# Patient Record
Sex: Female | Born: 1970 | Race: White | Hispanic: Yes | Marital: Married | Smoking: Never smoker
Health system: Southern US, Community
[De-identification: ages and names within clinical notes are randomized; demographics above are authoritative.]

## PROBLEM LIST (undated history)

## (undated) DIAGNOSIS — E785 Hyperlipidemia, unspecified: Secondary | ICD-10-CM

## (undated) DIAGNOSIS — E049 Nontoxic goiter, unspecified: Secondary | ICD-10-CM

## (undated) DIAGNOSIS — E010 Iodine-deficiency related diffuse (endemic) goiter: Secondary | ICD-10-CM

## (undated) DIAGNOSIS — K3580 Unspecified acute appendicitis: Secondary | ICD-10-CM

## (undated) DIAGNOSIS — I10 Essential (primary) hypertension: Secondary | ICD-10-CM

## (undated) DIAGNOSIS — R739 Hyperglycemia, unspecified: Secondary | ICD-10-CM

## (undated) HISTORY — DX: Hyperlipidemia, unspecified: E78.5

## (undated) HISTORY — DX: Hyperglycemia, unspecified: R73.9

## (undated) HISTORY — DX: Nontoxic goiter, unspecified: E04.9

## (undated) HISTORY — DX: Iodine-deficiency related diffuse (endemic) goiter: E01.0

---

## 1995-07-29 HISTORY — PX: TUBAL LIGATION: SHX77

## 2004-11-06 ENCOUNTER — Ambulatory Visit: Payer: Self-pay | Admitting: Internal Medicine

## 2004-11-07 ENCOUNTER — Ambulatory Visit: Payer: Self-pay | Admitting: *Deleted

## 2004-11-12 ENCOUNTER — Ambulatory Visit: Payer: Self-pay | Admitting: Internal Medicine

## 2004-11-29 ENCOUNTER — Ambulatory Visit: Payer: Self-pay | Admitting: Internal Medicine

## 2004-12-31 ENCOUNTER — Ambulatory Visit: Payer: Self-pay | Admitting: Internal Medicine

## 2005-06-09 ENCOUNTER — Ambulatory Visit: Payer: Self-pay | Admitting: Internal Medicine

## 2006-01-27 ENCOUNTER — Ambulatory Visit: Payer: Self-pay | Admitting: Family Medicine

## 2006-02-03 ENCOUNTER — Ambulatory Visit: Payer: Self-pay | Admitting: Family Medicine

## 2006-04-15 ENCOUNTER — Ambulatory Visit: Payer: Self-pay | Admitting: Family Medicine

## 2006-04-15 ENCOUNTER — Encounter (INDEPENDENT_AMBULATORY_CARE_PROVIDER_SITE_OTHER): Payer: Self-pay | Admitting: Internal Medicine

## 2006-05-27 ENCOUNTER — Ambulatory Visit: Payer: Self-pay | Admitting: Family Medicine

## 2006-11-30 ENCOUNTER — Encounter (INDEPENDENT_AMBULATORY_CARE_PROVIDER_SITE_OTHER): Payer: Self-pay | Admitting: Internal Medicine

## 2006-11-30 DIAGNOSIS — J309 Allergic rhinitis, unspecified: Secondary | ICD-10-CM

## 2006-11-30 DIAGNOSIS — I10 Essential (primary) hypertension: Secondary | ICD-10-CM

## 2006-11-30 DIAGNOSIS — G43909 Migraine, unspecified, not intractable, without status migrainosus: Secondary | ICD-10-CM | POA: Insufficient documentation

## 2006-11-30 DIAGNOSIS — J302 Other seasonal allergic rhinitis: Secondary | ICD-10-CM | POA: Insufficient documentation

## 2007-06-14 ENCOUNTER — Telehealth (INDEPENDENT_AMBULATORY_CARE_PROVIDER_SITE_OTHER): Payer: Self-pay | Admitting: *Deleted

## 2007-06-22 ENCOUNTER — Ambulatory Visit: Payer: Self-pay | Admitting: Family Medicine

## 2007-07-20 ENCOUNTER — Ambulatory Visit: Payer: Self-pay | Admitting: Family Medicine

## 2007-08-18 ENCOUNTER — Emergency Department (HOSPITAL_COMMUNITY): Admission: EM | Admit: 2007-08-18 | Discharge: 2007-08-18 | Payer: Self-pay | Admitting: Emergency Medicine

## 2007-11-15 ENCOUNTER — Ambulatory Visit: Payer: Self-pay | Admitting: Family Medicine

## 2007-11-15 ENCOUNTER — Encounter (INDEPENDENT_AMBULATORY_CARE_PROVIDER_SITE_OTHER): Payer: Self-pay | Admitting: Family Medicine

## 2007-11-15 DIAGNOSIS — B3731 Acute candidiasis of vulva and vagina: Secondary | ICD-10-CM | POA: Insufficient documentation

## 2007-11-15 DIAGNOSIS — B373 Candidiasis of vulva and vagina: Secondary | ICD-10-CM

## 2007-11-15 LAB — CONVERTED CEMR LAB
Blood in Urine, dipstick: NEGATIVE
Glucose, Urine, Semiquant: NEGATIVE
Ketones, urine, test strip: NEGATIVE
WBC Urine, dipstick: NEGATIVE
pH: 6.5

## 2007-11-18 LAB — CONVERTED CEMR LAB: Pap Smear: NORMAL

## 2010-08-25 LAB — CONVERTED CEMR LAB
ALT: 15 units/L (ref 0–35)
AST: 19 units/L (ref 0–37)
Albumin: 4.1 g/dL (ref 3.5–5.2)
Alkaline Phosphatase: 101 units/L (ref 39–117)
Basophils Absolute: 0 10*3/uL (ref 0.0–0.1)
Basophils Relative: 0 % (ref 0–1)
Calcium: 9 mg/dL (ref 8.4–10.5)
Chloride: 103 meq/L (ref 96–112)
LDL Cholesterol: 88 mg/dL (ref 0–99)
MCHC: 31 g/dL (ref 30.0–36.0)
Monocytes Relative: 6 % (ref 3–12)
Neutro Abs: 7.2 10*3/uL (ref 1.7–7.7)
Neutrophils Relative %: 75 % (ref 43–77)
Platelets: 384 10*3/uL (ref 150–400)
Potassium: 3.3 meq/L — ABNORMAL LOW (ref 3.5–5.3)
RBC: 4.93 M/uL (ref 3.87–5.11)
RDW: 16.1 % — ABNORMAL HIGH (ref 11.5–15.5)
Sodium: 140 meq/L (ref 135–145)
TSH: 0.812 microintl units/mL (ref 0.350–5.50)

## 2013-04-22 ENCOUNTER — Encounter (HOSPITAL_COMMUNITY): Payer: Self-pay | Admitting: Adult Health

## 2013-04-22 ENCOUNTER — Observation Stay (HOSPITAL_COMMUNITY)
Admission: EM | Admit: 2013-04-22 | Discharge: 2013-04-24 | Disposition: A | Discharge status: Home / Self Care | Payer: Self-pay | Attending: General Surgery | Admitting: General Surgery

## 2013-04-22 DIAGNOSIS — K3580 Unspecified acute appendicitis: Principal | ICD-10-CM | POA: Insufficient documentation

## 2013-04-22 DIAGNOSIS — R1031 Right lower quadrant pain: Secondary | ICD-10-CM | POA: Insufficient documentation

## 2013-04-22 DIAGNOSIS — I10 Essential (primary) hypertension: Secondary | ICD-10-CM | POA: Insufficient documentation

## 2013-04-22 DIAGNOSIS — K37 Unspecified appendicitis: Secondary | ICD-10-CM

## 2013-04-22 HISTORY — DX: Essential (primary) hypertension: I10

## 2013-04-22 HISTORY — DX: Unspecified acute appendicitis: K35.80

## 2013-04-22 LAB — COMPREHENSIVE METABOLIC PANEL
AST: 21 U/L (ref 0–37)
CO2: 24 mEq/L (ref 19–32)
Calcium: 9 mg/dL (ref 8.4–10.5)
Chloride: 98 mEq/L (ref 96–112)
Creatinine, Ser: 0.5 mg/dL (ref 0.50–1.10)
GFR calc Af Amer: >90 mL/min (ref >90)
GFR calc non Af Amer: >90 mL/min (ref >90)
Glucose, Bld: 113 mg/dL — ABNORMAL HIGH (ref 70–99)
Total Protein: 7.7 g/dL (ref 6.0–8.3)

## 2013-04-22 LAB — URINALYSIS, ROUTINE W REFLEX MICROSCOPIC
Leukocytes, UA: NEGATIVE
Protein, ur: NEGATIVE mg/dL
Specific Gravity, Urine: 1.006 (ref 1.005–1.030)
Urobilinogen, UA: 0.2 mg/dL (ref 0.0–1.0)

## 2013-04-22 LAB — URINE MICROSCOPIC-ADD ON

## 2013-04-22 LAB — CBC WITH DIFFERENTIAL/PLATELET
Basophils Absolute: 0 10*3/uL (ref 0.0–0.1)
Eosinophils Absolute: 0 10*3/uL (ref 0.0–0.7)
Eosinophils Relative: 0 % (ref 0–5)
HCT: 36.6 % (ref 36.0–46.0)
Lymphocytes Relative: 9 % — ABNORMAL LOW (ref 12–46)
Lymphs Abs: 1.5 10*3/uL (ref 0.7–4.0)
MCH: 26.7 pg (ref 26.0–34.0)
MCHC: 33.9 g/dL (ref 30.0–36.0)
MCV: 78.9 fL (ref 78.0–100.0)
Monocytes Absolute: 0.7 10*3/uL (ref 0.1–1.0)
Neutrophils Relative %: 86 % — ABNORMAL HIGH (ref 43–77)
Platelets: 299 10*3/uL (ref 150–400)
RBC: 4.64 MIL/uL (ref 3.87–5.11)
RDW: 16.3 % — ABNORMAL HIGH (ref 11.5–15.5)
WBC: 16.6 10*3/uL — ABNORMAL HIGH (ref 4.0–10.5)

## 2013-04-22 LAB — POCT PREGNANCY, URINE: Preg Test, Ur: NEGATIVE

## 2013-04-22 NOTE — ED Notes (Addendum)
Spanish speaking, interpreter phones used. Presents with lower abdominal pain that radiates into lower back and shoots up into umbilicus. Pain is associated with emesis x1 and frequent urination. painis described as "strong" and rated 7/10, bialterally.  Pain began this evening.

## 2013-04-22 NOTE — ED Notes (Signed)
Nurse First Rounds : Nurse explained delay / process and wait time to pt.

## 2013-04-23 ENCOUNTER — Emergency Department (HOSPITAL_COMMUNITY): Payer: Self-pay

## 2013-04-23 ENCOUNTER — Encounter (HOSPITAL_COMMUNITY): Payer: Self-pay | Admitting: Radiology

## 2013-04-23 ENCOUNTER — Encounter (HOSPITAL_COMMUNITY): Payer: Self-pay | Admitting: Anesthesiology

## 2013-04-23 ENCOUNTER — Observation Stay (HOSPITAL_COMMUNITY): Payer: Self-pay | Admitting: Anesthesiology

## 2013-04-23 ENCOUNTER — Encounter (HOSPITAL_COMMUNITY)
Admission: EM | Disposition: A | Discharge status: Home / Self Care | Payer: Self-pay | Source: Home / Self Care | Attending: Emergency Medicine

## 2013-04-23 DIAGNOSIS — K3580 Unspecified acute appendicitis: Secondary | ICD-10-CM

## 2013-04-23 HISTORY — DX: Unspecified acute appendicitis: K35.80

## 2013-04-23 HISTORY — PX: LAPAROSCOPIC APPENDECTOMY: SHX408

## 2013-04-23 LAB — SURGICAL PCR SCREEN: MRSA, PCR: NEGATIVE

## 2013-04-23 SURGERY — APPENDECTOMY, LAPAROSCOPIC
Anesthesia: General | Wound class: Contaminated

## 2013-04-23 MED ORDER — SODIUM CHLORIDE 0.9 % IV BOLUS (SEPSIS)
1000.0000 mL | Freq: Once | INTRAVENOUS | Status: AC
  Administered 2013-04-23: 1000 mL via INTRAVENOUS

## 2013-04-23 MED ORDER — NEOSTIGMINE METHYLSULFATE 1 MG/ML IJ SOLN
INTRAMUSCULAR | Status: DC | PRN
  Administered 2013-04-23: 3 mg via INTRAVENOUS

## 2013-04-23 MED ORDER — ONDANSETRON HCL 4 MG/2ML IJ SOLN
4.0000 mg | Freq: Once | INTRAMUSCULAR | Status: AC
  Administered 2013-04-23: 4 mg via INTRAVENOUS
  Filled 2013-04-23: qty 2

## 2013-04-23 MED ORDER — LIDOCAINE HCL (CARDIAC) 20 MG/ML IV SOLN
INTRAVENOUS | Status: DC | PRN
  Administered 2013-04-23: 100 mg via INTRAVENOUS

## 2013-04-23 MED ORDER — LACTATED RINGERS IV SOLN
INTRAVENOUS | Status: DC | PRN
  Administered 2013-04-23 (×2): via INTRAVENOUS

## 2013-04-23 MED ORDER — MEPERIDINE HCL 25 MG/ML IJ SOLN: 6.2500 mg | INTRAMUSCULAR | Status: DC | PRN

## 2013-04-23 MED ORDER — OXYCODONE HCL 5 MG PO TABS: 5.0000 mg | ORAL_TABLET | Freq: Once | ORAL | Status: DC | PRN

## 2013-04-23 MED ORDER — MORPHINE SULFATE 4 MG/ML IJ SOLN
4.0000 mg | INTRAMUSCULAR | Status: DC | PRN
  Administered 2013-04-23 – 2013-04-24 (×3): 4 mg via INTRAVENOUS
  Filled 2013-04-23 (×3): qty 1

## 2013-04-23 MED ORDER — ONDANSETRON HCL 4 MG/2ML IJ SOLN: 4.0000 mg | Freq: Four times a day (QID) | INTRAMUSCULAR | Status: DC | PRN

## 2013-04-23 MED ORDER — HYDROMORPHONE HCL PF 1 MG/ML IJ SOLN: 0.2500 mg | INTRAMUSCULAR | Status: DC | PRN

## 2013-04-23 MED ORDER — OXYCODONE HCL 5 MG/5ML PO SOLN: 5.0000 mg | Freq: Once | ORAL | Status: DC | PRN

## 2013-04-23 MED ORDER — BUPIVACAINE-EPINEPHRINE PF 0.25-1:200000 % IJ SOLN
INTRAMUSCULAR | Status: AC
  Filled 2013-04-23: qty 30

## 2013-04-23 MED ORDER — INFLUENZA VAC SPLIT QUAD 0.5 ML IM SUSP: 0.5000 mL | INTRAMUSCULAR | Status: DC

## 2013-04-23 MED ORDER — SUCCINYLCHOLINE CHLORIDE 20 MG/ML IJ SOLN
INTRAMUSCULAR | Status: DC | PRN
  Administered 2013-04-23: 100 mg via INTRAVENOUS

## 2013-04-23 MED ORDER — FENTANYL CITRATE 0.05 MG/ML IJ SOLN
INTRAMUSCULAR | Status: DC | PRN
  Administered 2013-04-23: 150 ug via INTRAVENOUS

## 2013-04-23 MED ORDER — PHENYLEPHRINE HCL 10 MG/ML IJ SOLN
INTRAMUSCULAR | Status: DC | PRN
  Administered 2013-04-23: 120 ug via INTRAVENOUS
  Administered 2013-04-23 (×6): 80 ug via INTRAVENOUS
  Administered 2013-04-23: 40 ug via INTRAVENOUS
  Administered 2013-04-23 (×2): 80 ug via INTRAVENOUS

## 2013-04-23 MED ORDER — PIPERACILLIN-TAZOBACTAM 3.375 G IVPB 30 MIN
3.3750 g | Freq: Once | INTRAVENOUS | Status: AC
  Administered 2013-04-23: 3.375 g via INTRAVENOUS
  Filled 2013-04-23: qty 50

## 2013-04-23 MED ORDER — GLYCOPYRROLATE 0.2 MG/ML IJ SOLN
INTRAMUSCULAR | Status: DC | PRN
  Administered 2013-04-23: 0.4 mg via INTRAVENOUS

## 2013-04-23 MED ORDER — BUPIVACAINE-EPINEPHRINE 0.25% -1:200000 IJ SOLN
INTRAMUSCULAR | Status: DC | PRN
  Administered 2013-04-23: 20 mL

## 2013-04-23 MED ORDER — POTASSIUM CHLORIDE IN NACL 20-0.9 MEQ/L-% IV SOLN
INTRAVENOUS | Status: DC
  Administered 2013-04-23 (×2): via INTRAVENOUS
  Filled 2013-04-23 (×4): qty 1000

## 2013-04-23 MED ORDER — SODIUM CHLORIDE 0.9 % IR SOLN
Status: DC | PRN
  Administered 2013-04-23: 1000 mL

## 2013-04-23 MED ORDER — PIPERACILLIN-TAZOBACTAM 3.375 G IVPB
3.3750 g | Freq: Three times a day (TID) | INTRAVENOUS | Status: DC
  Administered 2013-04-23: 3.375 g via INTRAVENOUS
  Filled 2013-04-23 (×3): qty 50

## 2013-04-23 MED ORDER — ROCURONIUM BROMIDE 100 MG/10ML IV SOLN
INTRAVENOUS | Status: DC | PRN
  Administered 2013-04-23: 30 mg via INTRAVENOUS

## 2013-04-23 MED ORDER — MORPHINE SULFATE 4 MG/ML IJ SOLN
4.0000 mg | Freq: Once | INTRAMUSCULAR | Status: AC
  Administered 2013-04-23: 4 mg via INTRAVENOUS
  Filled 2013-04-23: qty 1

## 2013-04-23 MED ORDER — ONDANSETRON HCL 4 MG/2ML IJ SOLN
INTRAMUSCULAR | Status: DC | PRN
  Administered 2013-04-23: 4 mg via INTRAVENOUS

## 2013-04-23 MED ORDER — MIDAZOLAM HCL 5 MG/5ML IJ SOLN
INTRAMUSCULAR | Status: DC | PRN
  Administered 2013-04-23: 2 mg via INTRAVENOUS

## 2013-04-23 MED ORDER — ONDANSETRON HCL 4 MG/2ML IJ SOLN: 4.0000 mg | Freq: Once | INTRAMUSCULAR | Status: DC | PRN

## 2013-04-23 MED ORDER — PROPOFOL 10 MG/ML IV BOLUS
INTRAVENOUS | Status: DC | PRN
  Administered 2013-04-23: 130 mg via INTRAVENOUS

## 2013-04-23 SURGICAL SUPPLY — 39 items
ADH SKN CLS APL DERMABOND .7 (GAUZE/BANDAGES/DRESSINGS) ×1
APPLIER CLIP ROT 10 11.4 M/L (STAPLE)
APR CLP MED LRG 11.4X10 (STAPLE)
BAG SPEC RTRVL LRG 6X4 10 (ENDOMECHANICALS) ×1
BLADE SURG ROTATE 9660 (MISCELLANEOUS) IMPLANT
CANISTER SUCTION 2500CC (MISCELLANEOUS) ×2 IMPLANT
CHLORAPREP W/TINT 26ML (MISCELLANEOUS) ×2 IMPLANT
CLIP APPLIE ROT 10 11.4 M/L (STAPLE) IMPLANT
CLOTH BEACON ORANGE TIMEOUT ST (SAFETY) IMPLANT
COVER SURGICAL LIGHT HANDLE (MISCELLANEOUS) ×2 IMPLANT
CUTTER FLEX LINEAR 45M (STAPLE) ×2 IMPLANT
DECANTER SPIKE VIAL GLASS SM (MISCELLANEOUS) ×2 IMPLANT
DERMABOND ADVANCED (GAUZE/BANDAGES/DRESSINGS) ×1
DERMABOND ADVANCED .7 DNX12 (GAUZE/BANDAGES/DRESSINGS) ×1 IMPLANT
DRAPE UTILITY 15X26 W/TAPE STR (DRAPE) ×4 IMPLANT
ELECT REM PT RETURN 9FT ADLT (ELECTROSURGICAL) ×2
ELECTRODE REM PT RTRN 9FT ADLT (ELECTROSURGICAL) ×1 IMPLANT
ENDOLOOP SUT PDS II  0 18 (SUTURE)
ENDOLOOP SUT PDS II 0 18 (SUTURE) IMPLANT
GLOVE BIO SURGEON STRL SZ7.5 (GLOVE) ×2 IMPLANT
GOWN STRL NON-REIN LRG LVL3 (GOWN DISPOSABLE) ×4 IMPLANT
KIT BASIN OR (CUSTOM PROCEDURE TRAY) ×2 IMPLANT
KIT ROOM TURNOVER OR (KITS) ×2 IMPLANT
NS IRRIG 1000ML POUR BTL (IV SOLUTION) ×2 IMPLANT
PAD ARMBOARD 7.5X6 YLW CONV (MISCELLANEOUS) ×4 IMPLANT
POUCH SPECIMEN RETRIEVAL 10MM (ENDOMECHANICALS) ×2 IMPLANT
RELOAD STAPLE 45 3.5 BLU ETS (ENDOMECHANICALS) ×1 IMPLANT
RELOAD STAPLE TA45 3.5 REG BLU (ENDOMECHANICALS) ×2 IMPLANT
SCALPEL HARMONIC ACE (MISCELLANEOUS) ×2 IMPLANT
SET IRRIG TUBING LAPAROSCOPIC (IRRIGATION / IRRIGATOR) ×2 IMPLANT
SPECIMEN JAR SMALL (MISCELLANEOUS) ×2 IMPLANT
SUT MNCRL AB 4-0 PS2 18 (SUTURE) ×2 IMPLANT
TOWEL OR 17X24 6PK STRL BLUE (TOWEL DISPOSABLE) ×2 IMPLANT
TOWEL OR 17X26 10 PK STRL BLUE (TOWEL DISPOSABLE) ×2 IMPLANT
TRAY FOLEY CATH 16FR SILVER (SET/KITS/TRAYS/PACK) ×2 IMPLANT
TRAY LAPAROSCOPIC (CUSTOM PROCEDURE TRAY) ×2 IMPLANT
TROCAR XCEL BLUNT TIP 100MML (ENDOMECHANICALS) ×2 IMPLANT
TROCAR XCEL NON-BLD 5MMX100MML (ENDOMECHANICALS) ×4 IMPLANT
WATER STERILE IRR 1000ML POUR (IV SOLUTION) IMPLANT

## 2013-04-23 NOTE — Anesthesia Postprocedure Evaluation (Signed)
Anesthesia Post Note  Patient: Alexandra Wu  Procedure(s) Performed: Procedure(s) (LRB): APPENDECTOMY LAPAROSCOPIC (N/A)  Anesthesia type: general  Patient location: PACU  Post pain: Pain level controlled  Post assessment: Patient's Cardiovascular Status Stable  Last Vitals:  Filed Vitals:   04/23/13 0945  BP: 110/67  Pulse: 90  Temp:   Resp: 19    Post vital signs: Reviewed and stable  Level of consciousness: sedated  Complications: No apparent anesthesia complications

## 2013-04-23 NOTE — H&P (Signed)
Alexandra Wu is an 42 y.o. female.   Chief Complaint: Right lower quadrant abdominal pain HPI: This is a 42 year old Hispanic female. She does not speak [place]. All history is obtained her daughter. She started having right-sided abdominal pain and periumbilical pain yesterday. She also had nausea and vomiting. The pain is now sharp and constant and moderate to severe. She denies hematemesis. Bowel movements are normal. She denies fevers or chills  Past Medical History  Diagnosis Date  . Hypertension     History reviewed. No pertinent past surgical history.  History reviewed. No pertinent family history. Social History:  reports that she has never smoked. She does not have any smokeless tobacco history on file. She reports that she does not drink alcohol or use illicit drugs.  Allergies: No Known Allergies  Medications Prior to Admission  Medication Sig Dispense Refill  . lisinopril (PRINIVIL,ZESTRIL) 40 MG tablet Take 40 mg by mouth daily.      . Multiple Vitamins-Minerals (MULTIVITAMIN WITH MINERALS) tablet Take 1 tablet by mouth daily.        Results for orders placed during the hospital encounter of 04/22/13 (from the past 48 hour(s))  CBC WITH DIFFERENTIAL     Status: Abnormal   Collection Time    04/22/13  7:19 PM      Result Value Range   WBC 16.6 (*) 4.0 - 10.5 K/uL   RBC 4.64  3.87 - 5.11 MIL/uL   Hemoglobin 12.4  12.0 - 15.0 g/dL   HCT 78.4  69.6 - 29.5 %   MCV 78.9  78.0 - 100.0 fL   MCH 26.7  26.0 - 34.0 pg   MCHC 33.9  30.0 - 36.0 g/dL   RDW 28.4 (*) 13.2 - 44.0 %   Platelets 299  150 - 400 K/uL   Neutrophils Relative % 86 (*) 43 - 77 %   Neutro Abs 14.3 (*) 1.7 - 7.7 K/uL   Lymphocytes Relative 9 (*) 12 - 46 %   Lymphs Abs 1.5  0.7 - 4.0 K/uL   Monocytes Relative 4  3 - 12 %   Monocytes Absolute 0.7  0.1 - 1.0 K/uL   Eosinophils Relative 0  0 - 5 %   Eosinophils Absolute 0.0  0.0 - 0.7 K/uL   Basophils Relative 0  0 - 1 %   Basophils Absolute 0.0  0.0  - 0.1 K/uL  COMPREHENSIVE METABOLIC PANEL     Status: Abnormal   Collection Time    04/22/13  7:19 PM      Result Value Range   Sodium 135  135 - 145 mEq/L   Potassium 3.5  3.5 - 5.1 mEq/L   Chloride 98  96 - 112 mEq/L   CO2 24  19 - 32 mEq/L   Glucose, Bld 113 (*) 70 - 99 mg/dL   BUN 9  6 - 23 mg/dL   Creatinine, Ser 1.02  0.50 - 1.10 mg/dL   Calcium 9.0  8.4 - 72.5 mg/dL   Total Protein 7.7  6.0 - 8.3 g/dL   Albumin 3.8  3.5 - 5.2 g/dL   AST 21  0 - 37 U/L   ALT 15  0 - 35 U/L   Alkaline Phosphatase 73  39 - 117 U/L   Total Bilirubin 0.2 (*) 0.3 - 1.2 mg/dL   GFR calc non Af Amer >90  >90 mL/min   GFR calc Af Amer >90  >90 mL/min   Comment: (NOTE)  The eGFR has been calculated using the CKD EPI equation.     This calculation has not been validated in all clinical situations.     eGFR's persistently <90 mL/min signify possible Chronic Kidney     Disease.  LIPASE, BLOOD     Status: None   Collection Time    04/22/13  7:19 PM      Result Value Range   Lipase 29  11 - 59 U/L  URINALYSIS, ROUTINE W REFLEX MICROSCOPIC     Status: Abnormal   Collection Time    04/22/13  7:34 PM      Result Value Range   Color, Urine ORANGE (*) YELLOW   Comment: BIOCHEMICALS MAY BE AFFECTED BY COLOR   APPearance CLEAR  CLEAR   Specific Gravity, Urine 1.006  1.005 - 1.030   pH 8.5 (*) 5.0 - 8.0   Glucose, UA NEGATIVE  NEGATIVE mg/dL   Hgb urine dipstick NEGATIVE  NEGATIVE   Bilirubin Urine NEGATIVE  NEGATIVE   Ketones, ur NEGATIVE  NEGATIVE mg/dL   Protein, ur NEGATIVE  NEGATIVE mg/dL   Urobilinogen, UA 0.2  0.0 - 1.0 mg/dL   Nitrite POSITIVE (*) NEGATIVE   Leukocytes, UA NEGATIVE  NEGATIVE  URINE MICROSCOPIC-ADD ON     Status: Abnormal   Collection Time    04/22/13  7:34 PM      Result Value Range   Squamous Epithelial / LPF RARE  RARE   WBC, UA 0-2  <3 WBC/hpf   RBC / HPF 0-2  <3 RBC/hpf   Bacteria, UA FEW (*) RARE   Urine-Other AMORPHOUS URATES/PHOSPHATES    POCT PREGNANCY,  URINE     Status: None   Collection Time    04/22/13  7:48 PM      Result Value Range   Preg Test, Ur NEGATIVE  NEGATIVE   Comment:            THE SENSITIVITY OF THIS     METHODOLOGY IS >24 mIU/mL   Ct Abdomen Pelvis Wo Contrast  04/23/2013   CLINICAL DATA:  Right-sided flank pain. Rule out stone. Emesis and frequent urination.  EXAM: CT ABDOMEN AND PELVIS WITHOUT CONTRAST  TECHNIQUE: Multidetector CT imaging of the abdomen and pelvis was performed following the standard protocol without intravenous contrast.  COMPARISON:  None.  FINDINGS: Lung bases: Motion degradation. Mild cardiomegaly. Grossly clear lung bases. No pericardial or pleural effusion.  Abdomen/Pelvis: Motion degradation continuing into the upper abdomen. 2.6 cm low-density right liver lesion is favored to represent a cyst or minimally complex cyst. Normal infused appearance of the spleen, stomach, pancreas, gallbladder, biliary tract, adrenal glands.  No renal calculi or hydronephrosis. No hydroureter or ureteric calculi.  Mild aortic atherosclerosis. No retroperitoneal or retrocrural adenopathy. Normal colon and terminal ileum. The appendix is enlarged and contains a appendicolith approximately. Measures up to 1.2 cm on image 62. No gross surrounding inflammation seen. Evaluation for periappendiceal inflammation degraded by motion and lack of IV contrast.  Grossly normal small bowel, without free intraperitoneal air or fluid.  No pelvic adenopathy. Normal urinary bladder. Retroverted uterus. No gross adnexal mass or significant free pelvic fluid.  Bones/Musculoskeletal: No acute osseous abnormality.  IMPRESSION: 1. Motion degraded exam. 2. No urinary tract calculi or hydronephrosis. 3. Enlarged appendix with appendicolith within. Suspicious for acute appendicitis. Although no gross periappendiceal inflammation is seen, evaluation degraded by motion and lack of IV contrast. These results were called by telephone at the time of  interpretation on  04/23/2013 at 1:48 AM to Dr. Chaney Malling , who verbally acknowledged these results. 4. Right liver lobe lesion which is indeterminate. Possibly a complex cyst. Consider nonemergent followup dedicated ultrasound. If any history of primary malignancy, recommend dedicated pre and post contrast abdominal MRI (as an outpatient).   Electronically Signed   By: Jeronimo Greaves   On: 04/23/2013 01:49    Review of Systems  All other systems reviewed and are negative.    Blood pressure 139/76, pulse 80, temperature 98.1 F (36.7 C), temperature source Oral, resp. rate 20, height 4\' 11"  (1.499 m), weight 150 lb 9.2 oz (68.3 kg), SpO2 100.00%. Physical Exam  Constitutional: She is oriented to person, place, and time. She appears well-developed and well-nourished. No distress.  HENT:  Head: Normocephalic and atraumatic.  Right Ear: External ear normal.  Left Ear: External ear normal.  Nose: Nose normal.  Mouth/Throat: Oropharynx is clear and moist. No oropharyngeal exudate.  Eyes: Conjunctivae are normal. Pupils are equal, round, and reactive to light. Right eye exhibits no discharge. Left eye exhibits no discharge. No scleral icterus.  Neck: Normal range of motion. Neck supple. No tracheal deviation present. No thyromegaly present.  Cardiovascular: Normal rate, regular rhythm, normal heart sounds and intact distal pulses.   No murmur heard. Respiratory: Effort normal and breath sounds normal. No respiratory distress. She has no wheezes. She has no rales.  GI: Soft. There is tenderness. There is guarding.  Tenderness with guarding in the right lower quadrant  Musculoskeletal: Normal range of motion. She exhibits no edema and no tenderness.  Lymphadenopathy:    She has no cervical adenopathy.  Neurological: She is alert and oriented to person, place, and time.  Skin: Skin is warm and dry. No rash noted. She is not diaphoretic. No erythema.  Psychiatric: Her behavior is normal. Judgment  normal.     Assessment/Plan Acute appendicitis    Removal of the appendix is recommended. I discussed this briefly with the family. I discussed the laparoscopic approach. I discussed the risk of surgery as well. IV antibiotics have been given. Surgery will be planned for today  Aundra Espin A 04/23/2013, 6:21 AM

## 2013-04-23 NOTE — Anesthesia Preprocedure Evaluation (Addendum)
Anesthesia Evaluation  Patient identified by MRN, date of birth, ID band Patient awake    Reviewed: Allergy & Precautions, H&P , NPO status , Patient's Chart, lab work & pertinent test results  Airway Mallampati: I TM Distance: >3 FB Neck ROM: Full    Dental  (+) Teeth Intact   Pulmonary          Cardiovascular hypertension, Pt. on medications     Neuro/Psych    GI/Hepatic   Endo/Other    Renal/GU      Musculoskeletal   Abdominal   Peds  Hematology   Anesthesia Other Findings   Reproductive/Obstetrics                          Anesthesia Physical Anesthesia Plan  ASA: II and emergent  Anesthesia Plan: General   Post-op Pain Management:    Induction: Intravenous  Airway Management Planned: Oral ETT  Additional Equipment:   Intra-op Plan:   Post-operative Plan: Extubation in OR  Informed Consent: I have reviewed the patients History and Physical, chart, labs and discussed the procedure including the risks, benefits and alternatives for the proposed anesthesia with the patient or authorized representative who has indicated his/her understanding and acceptance.     Plan Discussed with: CRNA and Surgeon  Anesthesia Plan Comments:         Anesthesia Quick Evaluation

## 2013-04-23 NOTE — Preoperative (Signed)
Beta Blockers   Reason not to administer Beta Blockers:Not Applicable 

## 2013-04-23 NOTE — ED Provider Notes (Addendum)
CSN: [id number]     Arrival date & time 04/22/13  1855 History   First MD Initiated Contact with Patient 04/22/13 2344     Chief Complaint  Patient presents with  . Abdominal Pain   (Consider location/radiation/quality/duration/timing/severity/associated sxs/prior Treatment) The history is provided by the patient. The history is limited by a language barrier. A language interpreter was used.  Shiryl Ruddy is a 42 y.o. female hx of HTN here with abdominal pain and dysuria. Dysuria started today and frequency. Also R flank pain that is intermittent. She also complained of crampy periumbilical pain with radiation to the epigastrium. Denies fevers or chills but felt nauseous but did not vomit. Has history of high blood pressure but denies any abdominal surgeries in the past.   Translation by daughter  Past Medical History  Diagnosis Date  . Hypertension    History reviewed. No pertinent past surgical history. History reviewed. No pertinent family history. History  Substance Use Topics  . Smoking status: Never Smoker   . Smokeless tobacco: Not on file  . Alcohol Use: No   OB History   Grav Para Term Preterm Abortions TAB SAB Ect Mult Living                 Review of Systems  Gastrointestinal: Positive for nausea and abdominal pain.  All other systems reviewed and are negative.    Allergies  Review of patient's allergies indicates no known allergies.  Home Medications   Current Outpatient Rx  Name  Route  Sig  Dispense  Refill  . lisinopril (PRINIVIL,ZESTRIL) 40 MG tablet   Oral   Take 40 mg by mouth daily.         . Multiple Vitamins-Minerals (MULTIVITAMIN WITH MINERALS) tablet   Oral   Take 1 tablet by mouth daily.          BP 142/79  Pulse 84  Temp(Src) 98.7 F (37.1 C) (Oral)  Resp 18  SpO2 98% Physical Exam  Nursing note and vitals reviewed. Constitutional: She is oriented to person, place, and time. She appears well-developed and well-nourished.   Slightly uncomfortable   HENT:  Head: Normocephalic.  Mouth/Throat: Oropharynx is clear and moist.  Eyes: Conjunctivae are normal. Pupils are equal, round, and reactive to light.  Neck: Normal range of motion. Neck supple.  Cardiovascular: Normal rate, regular rhythm and normal heart sounds.   Pulmonary/Chest: Effort normal and breath sounds normal. No respiratory distress. She has no wheezes. She has no rales.  Abdominal: Soft. Bowel sounds are normal.  + mild R CVAT. Mild periumbilical and RLQ tenderness but no rebound.   Musculoskeletal: Normal range of motion.  Neurological: She is alert and oriented to person, place, and time.  Skin: Skin is warm and dry.  Psychiatric: She has a normal mood and affect. Her behavior is normal. Judgment and thought content normal.    ED Course  Procedures (including critical care time) Labs Review Labs Reviewed  CBC WITH DIFFERENTIAL - Abnormal; Notable for the following:    WBC 16.6 (*)    RDW 16.3 (*)    Neutrophils Relative % 86 (*)    Neutro Abs 14.3 (*)    Lymphocytes Relative 9 (*)    All other components within normal limits  COMPREHENSIVE METABOLIC PANEL - Abnormal; Notable for the following:    Glucose, Bld 113 (*)    Total Bilirubin 0.2 (*)    All other components within normal limits  URINALYSIS, ROUTINE W REFLEX MICROSCOPIC -  Abnormal; Notable for the following:    Color, Urine ORANGE (*)    pH 8.5 (*)    Nitrite POSITIVE (*)    All other components within normal limits  URINE MICROSCOPIC-ADD ON - Abnormal; Notable for the following:    Bacteria, UA FEW (*)    All other components within normal limits  LIPASE, BLOOD  POCT PREGNANCY, URINE   Imaging Review Ct Abdomen Pelvis Wo Contrast  04/23/2013   CLINICAL DATA:  Right-sided flank pain. Rule out stone. Emesis and frequent urination.  EXAM: CT ABDOMEN AND PELVIS WITHOUT CONTRAST  TECHNIQUE: Multidetector CT imaging of the abdomen and pelvis was performed following the  standard protocol without intravenous contrast.  COMPARISON:  None.  FINDINGS: Lung bases: Motion degradation. Mild cardiomegaly. Grossly clear lung bases. No pericardial or pleural effusion.  Abdomen/Pelvis: Motion degradation continuing into the upper abdomen. 2.6 cm low-density right liver lesion is favored to represent a cyst or minimally complex cyst. Normal infused appearance of the spleen, stomach, pancreas, gallbladder, biliary tract, adrenal glands.  No renal calculi or hydronephrosis. No hydroureter or ureteric calculi.  Mild aortic atherosclerosis. No retroperitoneal or retrocrural adenopathy. Normal colon and terminal ileum. The appendix is enlarged and contains a appendicolith approximately. Measures up to 1.2 cm on image 62. No gross surrounding inflammation seen. Evaluation for periappendiceal inflammation degraded by motion and lack of IV contrast.  Grossly normal small bowel, without free intraperitoneal air or fluid.  No pelvic adenopathy. Normal urinary bladder. Retroverted uterus. No gross adnexal mass or significant free pelvic fluid.  Bones/Musculoskeletal: No acute osseous abnormality.  IMPRESSION: 1. Motion degraded exam. 2. No urinary tract calculi or hydronephrosis. 3. Enlarged appendix with appendicolith within. Suspicious for acute appendicitis. Although no gross periappendiceal inflammation is seen, evaluation degraded by motion and lack of IV contrast. These results were called by telephone at the time of interpretation on 04/23/2013 at 1:48 AM to Dr. Chaney Malling , who verbally acknowledged these results. 4. Right liver lobe lesion which is indeterminate. Possibly a complex cyst. Consider nonemergent followup dedicated ultrasound. If any history of primary malignancy, recommend dedicated pre and post contrast abdominal MRI (as an outpatient).   Electronically Signed   By: Jeronimo Greaves   On: 04/23/2013 01:49    MDM  No diagnosis found. Ruweyda Macknight is a 42 y.o. female here with R  flank pain and periumbilical pain. UA + UTI. Need to r/o pyelo vs appy vs infected stone. Will get CT ab/pel, labs.   2:25 AM CT showed appendicitis. Given zosyn. Surgery will admit.    Richardean Canal, MD 04/23/13 0225  Richardean Canal, MD 04/23/13 [phone]

## 2013-04-23 NOTE — Op Note (Signed)
04/22/2013 - 04/23/2013  9:20 AM  PATIENT:  Alexandra Wu  42 y.o. female  PRE-OPERATIVE DIAGNOSIS:  appendicitis  POST-OPERATIVE DIAGNOSIS:  same  PROCEDURE:  Procedure(s): APPENDECTOMY LAPAROSCOPIC (N/A)  SURGEON:  Surgeon(s) and Role:    * Robyne Askew, MD - Primary  PHYSICIAN ASSISTANT:   ASSISTANTS: none   ANESTHESIA:   general  EBL:  Total I/O In: -  Out: 250 [Urine:250]  BLOOD ADMINISTERED:none  DRAINS: none   LOCAL MEDICATIONS USED:  MARCAINE     SPECIMEN:  Source of Specimen:  appendix  DISPOSITION OF SPECIMEN:  PATHOLOGY  COUNTS:  YES  TOURNIQUET:  * No tourniquets in log *  DICTATION: .Dragon Dictation After informed consent was obtained patient was brought to the operating room placed in the supine position on the operating room table. After adequate induction of general anesthesia the patient's abdomen was prepped with ChloraPrep, allowed to dry, and draped in usual sterile manner. The area below the umbilicus was infiltrated with quarter percent Marcaine. A small incision was made with a 15 blade knife. This incision was carried down through the subcutaneous tissue bluntly with a hemostat and [employer] retractors until the linea alba was identified. The linea alba was incised with a 15 blade knife. Each side was grasped Coker clamps and elevated anteriorly. The preperitoneal space was probed bluntly with a hemostat until the peritoneum was opened and access was gained to the abdominal cavity. A 0 Vicryl purse string stitch was placed in the fascia surrounding the opening. A Hassan cannula was placed through the opening and anchored in place with the previously placed Vicryl purse string stitch. The laparoscope was placed through the [facility] cannula. The abdomen was insufflated with carbon dioxide without difficulty. Next the suprapubic area was infiltrated with quarter percent Marcaine. A small incision was made with a 15 blade knife. A 5 mm port was placed  bluntly through this incision into the abdominal cavity. A site was then chosen between the 2 ports for placement of a 5 mm port. The area was infiltrated with quarter percent Marcaine. A small stab incision was made with a 15 blade knife. A 5 mm port was placed bluntly through this incision and the abdominal cavity under direct vision. The laparoscope was then moved to the suprapubic port. Using a Glassman grasper and harmonic scalpel the right lower quadrant was inspected. The appendix was readily identified. The appendix was elevated anteriorly and the mesoappendix was taken down sharply with the harmonic scalpel. Once the base of the appendix where it joined the cecum was identified and cleared of any tissue then a laparoscopic GIA blue load 6 row stapler was placed through the [facility] cannula. The stapler was placed across the base of the appendix clamped and fired thereby dividing the base of the appendix between staple lines. A laparoscopic bag was then inserted through the [facility] cannula. The appendix was placed within the bag and the bag was sealed. The abdomen was then irrigated with copious amounts of saline until the effluent was clear. No other abnormalities were noted. The appendix and bag were removed with the [facility] cannula through the infraumbilical port without difficulty. The fascial defect was closed with the previously placed Vicryl pursestring stitch as well as with another interrupted 0 Vicryl figure-of-eight stitch. The rest of the ports were removed under direct vision and were found to be hemostatic. The gas was allowed to escape. The skin incisions were closed with interrupted 4-0 Monocryl subcuticular stitches. Dermabond dressings  were applied. The patient tolerated the procedure well. At the end of the case all needle sponge and instrument counts were correct. The patient was then awakened and taken to recovery in stable condition.  PLAN OF CARE: Admit for overnight  observation  PATIENT DISPOSITION:  PACU - hemodynamically stable.   Delay start of Pharmacological VTE agent (>24hrs) due to surgical blood loss or risk of bleeding: not applicable

## 2013-04-23 NOTE — Anesthesia Procedure Notes (Signed)
Procedure Name: Intubation Date/Time: 04/23/2013 8:18 AM Performed by: Quentin Ore Pre-anesthesia Checklist: Patient identified, Emergency Drugs available, Suction available, Patient being monitored and Timeout performed Patient Re-evaluated:Patient Re-evaluated prior to inductionOxygen Delivery Method: Circle system utilized Preoxygenation: Pre-oxygenation with 100% oxygen Intubation Type: IV induction Ventilation: Mask ventilation without difficulty Laryngoscope Size: Mac and 3 Grade View: Grade I Tube type: Oral Tube size: 7.0 mm Number of attempts: 1 Airway Equipment and Method: Stylet Placement Confirmation: ETT inserted through vocal cords under direct vision,  positive ETCO2 and breath sounds checked- equal and bilateral Secured at: 21 cm Tube secured with: Tape Dental Injury: Teeth and Oropharynx as per pre-operative assessment

## 2013-04-23 NOTE — Transfer of Care (Signed)
Immediate Anesthesia Transfer of Care Note  Patient: Alexandra Wu  Procedure(s) Performed: Procedure(s): APPENDECTOMY LAPAROSCOPIC (N/A)  Patient Location: PACU  Anesthesia Type:General  Level of Consciousness: awake, alert  and oriented  Airway & Oxygen Therapy: Patient Spontanous Breathing and Patient connected to nasal cannula oxygen  Post-op Assessment: Report given to PACU RN, Post -op Vital signs reviewed and stable and Patient moving all extremities X 4  Post vital signs: Reviewed and stable  Complications: No apparent anesthesia complications

## 2013-04-23 NOTE — Interval H&P Note (Signed)
History and Physical Interval Note:  04/23/2013 7:50 AM  Alexandra Wu  has presented today for surgery, with the diagnosis of appendicitis  The various methods of treatment have been discussed with the patient and family. After consideration of risks, benefits and other options for treatment, the patient has consented to  Procedure(s): APPENDECTOMY LAPAROSCOPIC (N/A) as a surgical intervention .  The patient's history has been reviewed, patient examined, no change in status, stable for surgery.  I have reviewed the patient's chart and labs.  Questions were answered to the patient's satisfaction.     TOTH III,Hali Balgobin S

## 2013-04-24 ENCOUNTER — Encounter (HOSPITAL_COMMUNITY): Payer: Self-pay | Admitting: Surgery

## 2013-04-24 MED ORDER — OXYCODONE-ACETAMINOPHEN 5-325 MG PO TABS: 1.0000 | ORAL_TABLET | ORAL | Status: DC | PRN

## 2013-04-24 NOTE — Progress Notes (Signed)
1 Day Post-Op   Assessment: s/p Procedure(s): APPENDECTOMY LAPAROSCOPIC Patient Active Problem List   Diagnosis Date Noted  . Appendicitis, acute 04/23/2013  . CANDIDIASIS, VAGINAL 11/15/2007  . MIGRAINE HEADACHE 11/30/2006  . HYPERTENSION 11/30/2006  . ALLERGIC RHINITIS 11/30/2006    Doing well and able to go home  Plan: Discharge  Subjective: Not much pain, feels ok and able to go home  Objective: Vital signs in last 24 hours: Temp:  [98.1 F (36.7 C)-99.2 F (37.3 C)] 98.1 F (36.7 C) (09/28 0615) Pulse Rate:  [72-99] 73 (09/28 0615) Resp:  [10-20] 18 (09/28 0615) BP: (110-148)/(62-84) 144/82 mmHg (09/28 0615) SpO2:  [95 %-100 %] 96 % (09/28 0615)   Intake/Output from previous day: 09/27 0701 - 09/28 0700 In: 1960 [P.O.:960; I.V.:1000] Out: 250 [Urine:250]  General appearance: alert, cooperative and no distress Resp: clear to auscultation bilaterally Cardio: regular rate and rhythm, S1, S2 normal, no murmur, click, rub or gallop GI: Mild incisional tenderness, otherwise negative exam  Incision: healing well  Lab Results:   Recent Labs  04/22/13 1919  WBC 16.6*  HGB 12.4  HCT 36.6  PLT 299   BMET  Recent Labs  04/22/13 1919  NA 135  K 3.5  CL 98  CO2 24  GLUCOSE 113*  BUN 9  CREATININE 0.50  CALCIUM 9.0    MEDS, Scheduled . [START ON 04/25/2013] influenza vac split quadrivalent PF  0.5 mL Intramuscular Tomorrow-1000    Studies/Results: Ct Abdomen Pelvis Wo Contrast  04/23/2013   CLINICAL DATA:  Right-sided flank pain. Rule out stone. Emesis and frequent urination.  EXAM: CT ABDOMEN AND PELVIS WITHOUT CONTRAST  TECHNIQUE: Multidetector CT imaging of the abdomen and pelvis was performed following the standard protocol without intravenous contrast.  COMPARISON:  None.  FINDINGS: Lung bases: Motion degradation. Mild cardiomegaly. Grossly clear lung bases. No pericardial or pleural effusion.  Abdomen/Pelvis: Motion degradation continuing into  the upper abdomen. 2.6 cm low-density right liver lesion is favored to represent a cyst or minimally complex cyst. Normal infused appearance of the spleen, stomach, pancreas, gallbladder, biliary tract, adrenal glands.  No renal calculi or hydronephrosis. No hydroureter or ureteric calculi.  Mild aortic atherosclerosis. No retroperitoneal or retrocrural adenopathy. Normal colon and terminal ileum. The appendix is enlarged and contains a appendicolith approximately. Measures up to 1.2 cm on image 62. No gross surrounding inflammation seen. Evaluation for periappendiceal inflammation degraded by motion and lack of IV contrast.  Grossly normal small bowel, without free intraperitoneal air or fluid.  No pelvic adenopathy. Normal urinary bladder. Retroverted uterus. No gross adnexal mass or significant free pelvic fluid.  Bones/Musculoskeletal: No acute osseous abnormality.  IMPRESSION: 1. Motion degraded exam. 2. No urinary tract calculi or hydronephrosis. 3. Enlarged appendix with appendicolith within. Suspicious for acute appendicitis. Although no gross periappendiceal inflammation is seen, evaluation degraded by motion and lack of IV contrast. These results were called by telephone at the time of interpretation on 04/23/2013 at 1:48 AM to Dr. Chaney Malling , who verbally acknowledged these results. 4. Right liver lobe lesion which is indeterminate. Possibly a complex cyst. Consider nonemergent followup dedicated ultrasound. If any history of primary malignancy, recommend dedicated pre and post contrast abdominal MRI (as an outpatient).   Electronically Signed   By: Jeronimo Greaves   On: 04/23/2013 01:49      LOS: 2 days     Currie Paris, MD, [facility], [place] [phone]   04/24/2013 9:02 AM

## 2013-04-26 ENCOUNTER — Encounter (HOSPITAL_COMMUNITY): Payer: Self-pay | Admitting: General Surgery

## 2013-05-04 NOTE — Discharge Summary (Signed)
.  Patient ID: Alexandra Wu [id number] 42 y.o. 1971-06-01  Admission  Date: 04/22/2013  Discharge date and time: 04/24/2013 12:27 PM  Admitting Physician: Currie Paris  Discharge Physician: Currie Paris  Admission Diagnoses: Appendicitis [541]  Discharge Diagnoses: Acute appendicitis  Operations: Procedure(s): APPENDECTOMY LAPAROSCOPIC  Discharged Condition: good  Hospital Course: The patient was admitted, taken to the OR, underwent lap appy, did well post op and discharged the next morning  Consults: None  Significant Diagnostic Studies: Path: Diagnosis Appendix, Other than Incidental - ACUTE FULL THICKNESS APPENDICITIS WITH ACUTE SEROSITIS. - NO TUMOR SEEN - SEE COMMENT. Microscopic Comment    Disposition: Home

## 2014-05-08 ENCOUNTER — Ambulatory Visit: Payer: Self-pay | Attending: Family Medicine

## 2014-05-25 ENCOUNTER — Encounter: Payer: Self-pay | Admitting: Obstetrics & Gynecology

## 2017-02-07 LAB — GLUCOSE, POCT (MANUAL RESULT ENTRY): POC Glucose: 100 mg/dl — AB (ref 70–99)

## 2017-02-17 ENCOUNTER — Ambulatory Visit (INDEPENDENT_AMBULATORY_CARE_PROVIDER_SITE_OTHER): Payer: Self-pay | Admitting: Internal Medicine

## 2017-02-17 ENCOUNTER — Encounter: Payer: Self-pay | Admitting: Internal Medicine

## 2017-02-17 VITALS — BP 200/130 | HR 86 | Resp 12 | Ht <= 58 in | Wt 157.0 lb

## 2017-02-17 DIAGNOSIS — I10 Essential (primary) hypertension: Secondary | ICD-10-CM

## 2017-02-17 DIAGNOSIS — Z13220 Encounter for screening for lipoid disorders: Secondary | ICD-10-CM

## 2017-02-17 DIAGNOSIS — R739 Hyperglycemia, unspecified: Secondary | ICD-10-CM

## 2017-02-17 DIAGNOSIS — E010 Iodine-deficiency related diffuse (endemic) goiter: Secondary | ICD-10-CM

## 2017-02-17 HISTORY — DX: Hyperglycemia, unspecified: R73.9

## 2017-02-17 HISTORY — DX: Iodine-deficiency related diffuse (endemic) goiter: E01.0

## 2017-02-17 MED ORDER — LISINOPRIL-HYDROCHLOROTHIAZIDE 20-12.5 MG PO TABS: 1.0000 | ORAL_TABLET | Freq: Every day | ORAL | 11 refills | Status: DC

## 2017-02-17 MED ORDER — METOPROLOL TARTRATE 25 MG PO TABS: 25.0000 mg | ORAL_TABLET | Freq: Two times a day (BID) | ORAL | 11 refills | Status: DC

## 2017-02-17 NOTE — Progress Notes (Signed)
   Subjective:    Patient ID: Alexandra Wu, female    DOB: [birth year], 46 y.o.   MRN: [id number]  HPI   Here to establish  1.  Essential Hypertension:  Diagnosed 10 years ago.  Not clear her bp has ever been well controlled.  States in January following blood draw at [facility] on [address], she was told her kidney function was fine.    Patient shows me blood pressure medication including Amlodipine 10 mg daily, Lisinopril 20 mg, and separately, Diclofenac 75 mg twice daily. Was checked by [facility] on 02/07/2017 and bp was 204/108.  Switched to Lisinopril/HCTZ 20/12.5 mg that day. She is currently not taking any other of meds listed above--only Lisinopril/HCTZ   2. Hyperglycemia:  Sugar was 100 same date above.  States has had hyperglycemia previously  Current Meds  Medication Sig  . lisinopril-hydrochlorothiazide (PRINZIDE,ZESTORETIC) 20-12.5 MG tablet Take 1 tablet by mouth daily.  . Multiple Vitamins-Minerals (MULTIVITAMIN WITH MINERALS) tablet Take 1 tablet by mouth daily.  . [DISCONTINUED] lisinopril-hydrochlorothiazide (PRINZIDE,ZESTORETIC) 20-12.5 MG tablet Take 1 tablet by mouth daily.    No Known Allergies   Social History   Social History  . Marital status: Married    Spouse name: Reyne Dumas  . Number of children: 3  . Years of education: 1   Occupational History  . Not on file.   Social History Main Topics  . Smoking status: Never Smoker  . Smokeless tobacco: Never Used  . Alcohol use No  . Drug use: No  . Sexual activity: Not on file   Other Topics Concern  . Not on file   Social History Narrative   Originally from [place]   Came to [place] In 2006   Lives at home with husband, son and daughter in [place].     Daughter in law is Abby.      Review of Systems     Objective:   Physical Exam NAD HEENT:  PERRL, EOMI, wire effect of retinal vessls, TMs pearly gray, throat without injection Neck:  Supple, enlarged,  somewhat nodular feeling thyroid. Chest:  CTA CV:  RRR with Normal S1 and S2, No S3, or S4.  Radial and DP pulses normal and equal. Abd:  S, NT, No HSM or mass, No abdominal bruits. LE:  No edema        Assessment & Plan:  1.  Essential Hypertension:  Check CMP, CBC.  Continue Lisinopril/HCTZ.  Add Metoprolol 25 mg twice daily.  BP and pulse check in 1 week.    2.  Hyperglycemia:  A1C, CMP.  Will discuss diet and physical activity at follow up visit in 1 month.  3.  Thyromegaly:  TSH.  Suspect multinodular goiter.  4.  Screening for hypercholesterolemia:  FLP as fasting this morning.

## 2017-02-18 LAB — COMPREHENSIVE METABOLIC PANEL
ALBUMIN: 4.4 g/dL (ref 3.5–5.5)
ALT: 12 IU/L (ref 0–32)
AST: 19 IU/L (ref 0–40)
Albumin/Globulin Ratio: 1.5 (ref 1.2–2.2)
Alkaline Phosphatase: 85 IU/L (ref 39–117)
BUN/Creatinine Ratio: 14 (ref 9–23)
BUN: 7 mg/dL (ref 6–24)
Bilirubin Total: <0.2 mg/dL (ref 0.0–1.2)
CALCIUM: 9.1 mg/dL (ref 8.7–10.2)
CO2: 23 mmol/L (ref 20–29)
CREATININE: 0.5 mg/dL — AB (ref 0.57–1.00)
Chloride: 102 mmol/L (ref 96–106)
GFR calc Af Amer: 133 mL/min/{1.73_m2} (ref >59)
GFR calc non Af Amer: 116 mL/min/{1.73_m2} (ref >59)
Globulin, Total: 3 g/dL (ref 1.5–4.5)
Glucose: 104 mg/dL — ABNORMAL HIGH (ref 65–99)
Potassium: 3.9 mmol/L (ref 3.5–5.2)
Sodium: 142 mmol/L (ref 134–144)
TOTAL PROTEIN: 7.4 g/dL (ref 6.0–8.5)

## 2017-02-18 LAB — CBC WITH DIFFERENTIAL/PLATELET
BASOS ABS: 0 10*3/uL (ref 0.0–0.2)
BASOS: 0 %
EOS (ABSOLUTE): 0.1 10*3/uL (ref 0.0–0.4)
Eos: 1 %
HEMOGLOBIN: 13.1 g/dL (ref 11.1–15.9)
Hematocrit: 41.5 % (ref 34.0–46.6)
IMMATURE GRANULOCYTES: 0 %
Immature Grans (Abs): 0 10*3/uL (ref 0.0–0.1)
Lymphocytes Absolute: 1.9 10*3/uL (ref 0.7–3.1)
Lymphs: 23 %
MCH: 27.1 pg (ref 26.6–33.0)
MCHC: 31.6 g/dL (ref 31.5–35.7)
MCV: 86 fL (ref 79–97)
Monocytes Absolute: 0.6 10*3/uL (ref 0.1–0.9)
Monocytes: 7 %
NEUTROS PCT: 69 %
Neutrophils Absolute: 5.7 10*3/uL (ref 1.4–7.0)
PLATELETS: 302 10*3/uL (ref 150–379)
RBC: 4.83 x10E6/uL (ref 3.77–5.28)
RDW: 15.2 % (ref 12.3–15.4)
WBC: 8.3 10*3/uL (ref 3.4–10.8)

## 2017-02-18 LAB — HGB A1C W/O EAG: Hgb A1c MFr Bld: 5.6 % (ref 4.8–5.6)

## 2017-02-18 LAB — LIPID PANEL W/O CHOL/HDL RATIO
Cholesterol, Total: 176 mg/dL (ref 100–199)
HDL: 46 mg/dL (ref >39)
LDL Calculated: 87 mg/dL (ref 0–99)
TRIGLYCERIDES: 216 mg/dL — AB (ref 0–149)
VLDL CHOLESTEROL CAL: 43 mg/dL — AB (ref 5–40)

## 2017-02-18 LAB — TSH: TSH: 0.898 u[IU]/mL (ref 0.450–4.500)

## 2017-02-24 ENCOUNTER — Other Ambulatory Visit (INDEPENDENT_AMBULATORY_CARE_PROVIDER_SITE_OTHER): Payer: Self-pay

## 2017-02-24 VITALS — BP 190/100 | HR 74

## 2017-02-24 DIAGNOSIS — I10 Essential (primary) hypertension: Secondary | ICD-10-CM

## 2017-02-24 MED ORDER — METOPROLOL TARTRATE 50 MG PO TABS: ORAL_TABLET | ORAL | 11 refills | Status: DC

## 2017-02-24 NOTE — Progress Notes (Signed)
Per Dr. Amil Amen will recheck BP and pulse in 1 week

## 2017-03-04 ENCOUNTER — Ambulatory Visit (INDEPENDENT_AMBULATORY_CARE_PROVIDER_SITE_OTHER): Payer: Self-pay

## 2017-03-04 VITALS — BP 142/90 | HR 78

## 2017-03-04 DIAGNOSIS — I10 Essential (primary) hypertension: Secondary | ICD-10-CM

## 2017-03-24 ENCOUNTER — Encounter: Payer: Self-pay | Admitting: Internal Medicine

## 2017-03-24 ENCOUNTER — Ambulatory Visit (INDEPENDENT_AMBULATORY_CARE_PROVIDER_SITE_OTHER): Payer: Self-pay | Admitting: Internal Medicine

## 2017-03-24 VITALS — BP 184/110 | HR 70 | Resp 12 | Ht <= 58 in | Wt 159.0 lb

## 2017-03-24 DIAGNOSIS — E785 Hyperlipidemia, unspecified: Secondary | ICD-10-CM | POA: Insufficient documentation

## 2017-03-24 DIAGNOSIS — R739 Hyperglycemia, unspecified: Secondary | ICD-10-CM

## 2017-03-24 DIAGNOSIS — E049 Nontoxic goiter, unspecified: Secondary | ICD-10-CM

## 2017-03-24 DIAGNOSIS — I10 Essential (primary) hypertension: Secondary | ICD-10-CM

## 2017-03-24 HISTORY — DX: Hyperlipidemia, unspecified: E78.5

## 2017-03-24 HISTORY — DX: Nontoxic goiter, unspecified: E04.9

## 2017-03-24 NOTE — Patient Instructions (Addendum)
Tome un vaso de agua antes de cada comida Tome un minimo de 6 a 8 vasos de agua diarios Coma tres veces al dia Coma una proteina y Ardelia Mems grasa saludable con comida.  (huevos, pescado, pollo, pavo, y limite carnes rojas Coma 5 porciones diarias de legumbres.  Mezcle los colores Coma 2 porciones diarias de frutas con cascara cuando sea comestible Use platos pequeos Suelte su tenedor o cuchara despues de cada mordida hata que se mastique y se trague Come en la mesa con amigos o familiares por lo menos una vez al dia Apague la televisin y aparatos electrnicos durante la comida  Su objetivo debe ser perder Ardelia Mems libra por semana  Fill pill box the same day and time each week and check behind yourself regularly

## 2017-03-24 NOTE — Progress Notes (Signed)
   Subjective:    Patient ID: Alexandra Wu, female    DOB: [birth year], 46 y.o.   MRN: [id number]  HPI   1.  Essential Hypertension:  States thought was to take Metoprolol tartrate once daily and increased her dose to two 25 mg tabs once daily.  Not taking twice daily as prescribed.   Has 11 pills left from 02/17/2017 bottle, and was to be out about 4 days earlier, so about 10 days worth of medication that has been missed.   Has some extra Lisinopril/HCTZ as well in her bottle. States she is still finishing another bottle left over.  Discussed at length she should be done with her bottle each month.    Hyperglycemia:  Though A1C just in normal range at 5.6%  Nodular thyroid:  TSH was normal.   Dyslipidemia:  Discussed borderline low HDL and high Triglycerides and how this is frequently a pattern with developing DM.  Current Meds  Medication Sig  . lisinopril-hydrochlorothiazide (PRINZIDE,ZESTORETIC) 20-12.5 MG tablet Take 1 tablet by mouth daily.  . metoprolol tartrate (LOPRESSOR) 50 MG tablet 1 tab by mouth twice daily  . Multiple Vitamins-Minerals (MULTIVITAMIN WITH MINERALS) tablet Take 1 tablet by mouth daily.    No Known Allergies    Review of Systems     Objective:   Physical Exam NAD Neck:  Nodular thyroid Chest:  CTA CV:  RRR without murmur or rub, radial pulses normal and equal LE:  No edema       Assessment & Plan:  1.  Essential Hypertension:  Lifestyle changes for weight loss and control of BP discussed.  Increase Metoprolol to 50 mg and take twice daily and Lisinopril/HCTZ once daily without fail.  Pill box given  Recheck bp and pulse in 1 week.  OV with me in 3 months.  2.  Hyperglycemia:  A1C supports still in normal range with sugars, however, discussed her cholesterol suggests she is moving toward metabolic changes with DM.  Encouraged lifestyle changes for weight loss as above.  3.  Overweight:  Lifestyle changes.  4.  Dyslipidemia:  As  above.  5.  Nodular thyroid with normal TSH.  Send for thyroid ultrasound.

## 2017-03-31 ENCOUNTER — Other Ambulatory Visit (INDEPENDENT_AMBULATORY_CARE_PROVIDER_SITE_OTHER): Payer: Self-pay

## 2017-05-23 ENCOUNTER — Encounter: Payer: Self-pay | Admitting: Internal Medicine

## 2017-06-25 ENCOUNTER — Encounter: Payer: Self-pay | Admitting: Internal Medicine

## 2017-06-25 ENCOUNTER — Ambulatory Visit: Payer: Self-pay | Admitting: Internal Medicine

## 2017-06-25 VITALS — BP 198/100 | HR 86 | Resp 12 | Ht <= 58 in | Wt 160.0 lb

## 2017-06-25 DIAGNOSIS — E049 Nontoxic goiter, unspecified: Secondary | ICD-10-CM

## 2017-06-25 DIAGNOSIS — I10 Essential (primary) hypertension: Secondary | ICD-10-CM

## 2017-06-25 MED ORDER — METOPROLOL TARTRATE 50 MG PO TABS: ORAL_TABLET | ORAL | 3 refills | Status: DC

## 2017-06-25 MED ORDER — LISINOPRIL-HYDROCHLOROTHIAZIDE 20-12.5 MG PO TABS: 1.0000 | ORAL_TABLET | Freq: Every day | ORAL | 3 refills | Status: DC

## 2017-06-25 NOTE — Progress Notes (Signed)
   Subjective:    Patient ID: Alexandra Wu, female    DOB: 1971/06/04, 46 y.o.   MRN: [id number]  HPI   1.  Essential Hypertension:  States taking Lisinopril 20/12.5 mg daily.  Taking Metoprolol 50 mg twice daily.  States never misses.  States she is walking for 30 minutes every day.  Her weight is up by 1 lb.  Her HR is actually higher then it was before increasing her Metoprolol.   Avoiding salt.  Feels she is eating better, eating more vegetables, etc. Checks her BP at home and is getting in 150/90 with a wrist cuff.  Digital.    2.  Nodular thyroid:  TSH was normal after last visit.   No opening for the ultrasound of the thyroid as of yet.  3.  Heavy periods that last 2 week for many months with a lot of cramping.     Current Meds  Medication Sig  . lisinopril-hydrochlorothiazide (PRINZIDE,ZESTORETIC) 20-12.5 MG tablet Take 1 tablet by mouth daily.  . metoprolol tartrate (LOPRESSOR) 50 MG tablet 1 tab by mouth twice daily  . Multiple Vitamins-Minerals (MULTIVITAMIN WITH MINERALS) tablet Take 1 tablet by mouth daily.  . [DISCONTINUED] lisinopril-hydrochlorothiazide (PRINZIDE,ZESTORETIC) 20-12.5 MG tablet Take 1 tablet by mouth daily.  . [DISCONTINUED] metoprolol tartrate (LOPRESSOR) 50 MG tablet 1 tab by mouth twice daily   No Known Allergies    Review of Systems     Objective:   Physical Exam  NAD Lungs:  CTA CV:  RRR without murmur or rub.  Radial and DP pulses normal and equal. LE:  No edema      Assessment & Plan:  1.  Essential Hypertension:  Suspect not taking medication as described.  Later, pharmacy confirms she has missed almost 2 months worth of medication due to missed fills or very late fills. States her husband has difficulty picking up on monthly basis with his job. Feels switching to 90 day refills will be helpful Encouraged patient to share she is missing meds in future so we don't just keep adding unnecessary medication. BP follow up in 2  weeks, to bring her monitor to compare measurement.  2.  Heavy and prolonged periods:   Discuss period when follows up in 2 weeks. Last pap was [facility] years ago [facility].

## 2017-07-09 ENCOUNTER — Encounter: Payer: Self-pay | Admitting: Internal Medicine

## 2017-07-09 ENCOUNTER — Ambulatory Visit: Payer: Self-pay | Admitting: Internal Medicine

## 2017-07-09 VITALS — BP 140/98 | HR 70 | Resp 12 | Ht <= 58 in | Wt 155.0 lb

## 2017-07-09 DIAGNOSIS — R102 Pelvic and perineal pain: Secondary | ICD-10-CM

## 2017-07-09 DIAGNOSIS — N921 Excessive and frequent menstruation with irregular cycle: Secondary | ICD-10-CM

## 2017-07-09 DIAGNOSIS — I10 Essential (primary) hypertension: Secondary | ICD-10-CM

## 2017-07-09 MED ORDER — IBUPROFEN 200 MG PO TABS: ORAL_TABLET | ORAL | 0 refills | Status: DC

## 2017-07-09 NOTE — Progress Notes (Signed)
   Subjective:    Patient ID: Donald Pore, female    DOB: 05-Feb-1971, 46 y.o.   MRN: [id number]  HPI   1.  Essential Hypertension:  See previous visit--was missing meds previously due to difficulties getting to pharmacy monthly.  She has been taking Metoprolol twice daily and Lisinopril/HCTZ once daily without fail for past 2 weeks.  We changed her fills to 90 days at a time at November 29th visit.  2.  Periods with lots of cramping, lasting 3 days, goes away for a couple of days, then has flow for another 3 days with cramping again.  Feels her bleeding is heavy for the 6 days of flow.   Has always had heavy periods, just did not have the 2 day stop in flow. Previously lasted 5 days, with only 3 of the days with heavy flow.   Feels burning in her vaginal area before her period.   These symptoms for a year. Last pap [facility] years ago at [facility].   Sounds like had BV and treated then. LMP:  06/28/2017  Current Meds  Medication Sig  . lisinopril-hydrochlorothiazide (PRINZIDE,ZESTORETIC) 20-12.5 MG tablet Take 1 tablet by mouth daily.  . metoprolol tartrate (LOPRESSOR) 50 MG tablet 1 tab by mouth twice daily  . Multiple Vitamins-Minerals (MULTIVITAMIN WITH MINERALS) tablet Take 1 tablet by mouth daily.    No Known Allergies  Review of Systems     Objective:   Physical Exam  NAD Lungs:  CTA CV: RRR without murmur or rub, radial and DP pulses normal and equal Abd:  S, + BS, No HSM or mass, + BS GU:  Normal external genitalia.  No uterine or adnexal mass, mildly tender on palpation of uterus.  No vaginal discharge.  Cervix without lesion.        Assessment & Plan:  1.  Essential Hypertension:  Improved with consistency of medication use.  Not quite at goal.   Return in 1 month for BP and pulse check with nurse. With me in 3 months.  2.  Heavy periods with new on and off flow over past year:  Possibly just part of perimenopause, but check pelvic  ultrasound to confirm no concerning internal uterine abnormality. Ibuprofen for pain with period.

## 2017-08-07 ENCOUNTER — Ambulatory Visit (INDEPENDENT_AMBULATORY_CARE_PROVIDER_SITE_OTHER): Payer: Self-pay

## 2017-08-07 VITALS — BP 150/90 | HR 58

## 2017-08-07 DIAGNOSIS — I10 Essential (primary) hypertension: Secondary | ICD-10-CM

## 2017-08-07 NOTE — Progress Notes (Signed)
Patient states she has been taking medication daily without missing any doses. Patient states she had a week of not sleeping very well so she thinks that is what has her BP elevated.

## 2017-08-10 NOTE — Progress Notes (Signed)
If she does not have a follow up BP, then need to have her come back for repeat in a couple of weeks for nurse check again.

## 2017-08-11 NOTE — Progress Notes (Signed)
Nicky please call patient and schedule repeat BP check in 2 weeks. Please make sure patient is informed to take medication before she comes in for appointment.

## 2017-08-12 NOTE — Progress Notes (Signed)
Patient was informed and appt. Was made for 08/26/17 at 4:30 p.m.

## 2017-08-26 ENCOUNTER — Ambulatory Visit (INDEPENDENT_AMBULATORY_CARE_PROVIDER_SITE_OTHER): Payer: Self-pay

## 2017-08-26 VITALS — BP 130/90 | HR 70

## 2017-08-26 DIAGNOSIS — I10 Essential (primary) hypertension: Secondary | ICD-10-CM

## 2017-08-26 NOTE — Progress Notes (Signed)
Patient BP has come down. Informed to continue both BP medications and will call with any changes

## 2017-11-05 ENCOUNTER — Ambulatory Visit: Payer: Self-pay | Admitting: Internal Medicine

## 2017-11-05 ENCOUNTER — Encounter: Payer: Self-pay | Admitting: Internal Medicine

## 2017-11-05 VITALS — BP 150/88 | HR 66 | Resp 12 | Ht <= 58 in | Wt 158.0 lb

## 2017-11-05 DIAGNOSIS — N939 Abnormal uterine and vaginal bleeding, unspecified: Secondary | ICD-10-CM

## 2017-11-05 DIAGNOSIS — J302 Other seasonal allergic rhinitis: Secondary | ICD-10-CM

## 2017-11-05 DIAGNOSIS — M542 Cervicalgia: Secondary | ICD-10-CM

## 2017-11-05 DIAGNOSIS — I10 Essential (primary) hypertension: Secondary | ICD-10-CM

## 2017-11-05 DIAGNOSIS — F439 Reaction to severe stress, unspecified: Secondary | ICD-10-CM

## 2017-11-05 MED ORDER — FEXOFENADINE HCL 180 MG PO TABS: 180.0000 mg | ORAL_TABLET | Freq: Every day | ORAL | Status: DC

## 2017-11-05 MED ORDER — HYDROCHLOROTHIAZIDE 12.5 MG PO TABS: ORAL_TABLET | ORAL | 11 refills | Status: DC

## 2017-11-05 NOTE — Progress Notes (Signed)
   Subjective:    Patient ID: Alexandra Wu, female    DOB: [birth year], 47 y.o.   MRN: [id number]  HPI   1.  Essential Hypertension: States only missed one tab this week.  States otherwise never misses.  Describes taking Metoprolol twice daily and Lisinopril/HcTZ daily.    2.  Poor sleep:  Having back pain and nasal congestion with sore throat, the latter making it hard for her to breath.  The nasal congestion started about 2 weeks ago with onset of tree pollen.  She is also having itchy eyes. The back pain is really posterior neck pain.  This has been bothering her for 1 month.  Has not tried medication for the neck pain.  Did try Amoxicillin for the nasal congestion.  Discussed why obtaining an antibiotic without a prescription is a bad idea and drug resistance. Having some stress dealing with a child.  3.  Abnormal uterine bleeding:  Never received a slot for pelvic ultrasound.  Last period was March 23rd and stayed for 12 days.   Had period in Jan/Feb similar to what she had in December--period started for 3 days, stopped 2 days and resumed for 3 days.  Both times with moderate flow. GU exam was unremarkable with visit in December.  Current Meds  Medication Sig  . ibuprofen (ADVIL,MOTRIN) 200 MG tablet 2-4 tabs by mouth every 6 hours as needed for cramping with food  . lisinopril-hydrochlorothiazide (PRINZIDE,ZESTORETIC) 20-12.5 MG tablet Take 1 tablet by mouth daily.  . metoprolol tartrate (LOPRESSOR) 50 MG tablet 1 tab by mouth twice daily  . Multiple Vitamins-Minerals (MULTIVITAMIN WITH MINERALS) tablet Take 1 tablet by mouth daily.    No Known Allergies  Review of Systems     Objective:   Physical Exam  NAD HEENT:  PERRL, EOMI, Conjunctivae without injection.  TMs pearly gray.  Throat without cobbling.  Nasal bogginess and clear discharge. Neck:  Supple, No adenopathy  NT over spinous processes of Cspine.  Tender and tight over paraspinous musculature and  traps. Chest:  CTA CV:  RRR without murmur or rub radial pulses normal and equal Abd:  S, NT, No HSM or mass.  No suprapubic dullness to percussion or palpable uterus above pubis.  + BS    Assessment & Plan:  1.  Essential Hypertension: add HCTZ 12.5 mg to Lisinopril/HCTZ and Metoprolol.  If controls BP, will switch to 1 tab combination of the two former tabs.   BP and pulse check with BMP in 2 weeks.  2.  Seasonal allergies:   Fexofenadine 180 mg daily.  To call in 2 weeks if no mprovement and consider nasal corticosteroids  3.  Stress:  She is having difficulties apparently dealing with a child, but seems resistant to counseling.  Ultimately states she open to speaking with Metta Clines, LCSW.  4.  Abnormal Uterine Bleeding:  May be due to perimenopause.  Looking into when when we can get an ultrasound through [facility] orange card.  5.  Neck pain :  Ibuprofen and stretches discussed.

## 2017-11-10 ENCOUNTER — Telehealth: Payer: Self-pay | Admitting: Licensed Clinical Social Worker

## 2017-11-10 NOTE — Telephone Encounter (Signed)
LCSW called pt following referral from Dr. Amil Amen. Pt stated that she was not interested in counseling at this time, though she does experience stress. She shared that her adult daughter is a lesbian and this is very hard for her to accept, as it goes against her religious beliefs. She shared that she goes to church to cope with her stress. LCSW encouraged her to call to schedule for counseling at any time.

## 2017-11-19 ENCOUNTER — Other Ambulatory Visit: Payer: Self-pay

## 2017-11-23 ENCOUNTER — Other Ambulatory Visit: Payer: Self-pay

## 2017-11-23 VITALS — BP 150/80 | HR 68

## 2017-11-23 DIAGNOSIS — Z79899 Other long term (current) drug therapy: Secondary | ICD-10-CM

## 2017-11-24 LAB — BASIC METABOLIC PANEL
BUN/Creatinine Ratio: 14 (ref 9–23)
BUN: 7 mg/dL (ref 6–24)
CALCIUM: 9.1 mg/dL (ref 8.7–10.2)
CO2: 20 mmol/L (ref 20–29)
Chloride: 101 mmol/L (ref 96–106)
Creatinine, Ser: 0.5 mg/dL — ABNORMAL LOW (ref 0.57–1.00)
GFR calc Af Amer: 132 mL/min/{1.73_m2} (ref >59)
GFR, EST NON AFRICAN AMERICAN: 115 mL/min/{1.73_m2} (ref >59)
Glucose: 109 mg/dL — ABNORMAL HIGH (ref 65–99)
POTASSIUM: 4.1 mmol/L (ref 3.5–5.2)
Sodium: 139 mmol/L (ref 134–144)

## 2017-12-07 ENCOUNTER — Ambulatory Visit: Payer: Self-pay

## 2017-12-07 VITALS — BP 130/82 | HR 58

## 2017-12-07 DIAGNOSIS — I10 Essential (primary) hypertension: Secondary | ICD-10-CM

## 2017-12-07 NOTE — Progress Notes (Signed)
Patient BP now in normal range. Informed to continue current dose of medication and make sure she is not skipping doses. Patient verbalized understanding

## 2018-01-31 ENCOUNTER — Telehealth: Payer: Self-pay | Admitting: Internal Medicine

## 2018-02-22 NOTE — Telephone Encounter (Signed)
Referral to be sent to [facility] 02/25/18 to be schedule. -Marked as priority.

## 2018-02-25 ENCOUNTER — Encounter: Payer: Self-pay | Admitting: Internal Medicine

## 2018-03-03 ENCOUNTER — Encounter: Payer: Self-pay | Admitting: Internal Medicine

## 2018-03-03 ENCOUNTER — Ambulatory Visit: Payer: Self-pay | Admitting: Internal Medicine

## 2018-03-03 VITALS — BP 138/90 | HR 66 | Resp 12 | Ht <= 58 in | Wt 158.0 lb

## 2018-03-03 DIAGNOSIS — I10 Essential (primary) hypertension: Secondary | ICD-10-CM

## 2018-03-03 DIAGNOSIS — Z1239 Encounter for other screening for malignant neoplasm of breast: Secondary | ICD-10-CM

## 2018-03-03 DIAGNOSIS — Z23 Encounter for immunization: Secondary | ICD-10-CM

## 2018-03-03 DIAGNOSIS — Z1231 Encounter for screening mammogram for malignant neoplasm of breast: Secondary | ICD-10-CM

## 2018-03-03 DIAGNOSIS — Z0000 Encounter for general adult medical examination without abnormal findings: Secondary | ICD-10-CM

## 2018-03-03 MED ORDER — LISINOPRIL-HYDROCHLOROTHIAZIDE 20-25 MG PO TABS: 1.0000 | ORAL_TABLET | Freq: Every day | ORAL | 3 refills | Status: DC

## 2018-03-03 MED ORDER — CETIRIZINE HCL 10 MG PO TABS: 10.0000 mg | ORAL_TABLET | Freq: Every day | ORAL | 11 refills | Status: DC

## 2018-03-03 NOTE — Progress Notes (Signed)
Subjective:    Patient ID: Alexandra Wu, female    DOB: 07-04-1971, 47 y.o.   MRN: [id number]  HPI   CPE without pap  1.  Pap:  Previously was 2 years ago at a [facility].  Always normal.  No family history of cervical cancer.  2.  Mammogram:  Last was 2 years ago at [facility].  Normal.  No family history of breast cancer.    3.  Osteoprevention:  Does not eat or drink cheese or yogurt everyday, but does eat maybe every other day.  She does like milk.  She drinks soy milk and would be willing to drink 3 cups daily.   Walking daily for about 30 to 40 minutes.    4.  Guaiac Cards: Never.  5.  Colonoscopy:  Never.  No family history of colon cancer.  6.  Immunizations:  She cannot recall when she has had a Tetanus vaccine.  Immunization History  Administered Date(s) Administered  . Influenza Whole 05/27/2006    7.  Glucose/Cholesterol:  Has had A1C of 5.6% in 01/2017 and elevated triglycerides similar date as well.  Lipid Panel     Component Value Date/Time   CHOL 176 02/17/2017 1004   TRIG 216 (H) 02/17/2017 1004   HDL 46 02/17/2017 1004   CHOLHDL 4.1 Ratio 06/22/2007 0000   VLDL 38 06/22/2007 0000   LDLCALC 87 02/17/2017 1004    Essential Hypertension:  Not taking her HCTZ 12.5 mg extra that prescribed last April.  States the pharmacy has not been filling for her.   Current Meds  Medication Sig  . ibuprofen (ADVIL,MOTRIN) 200 MG tablet 2-4 tabs by mouth every 6 hours as needed for cramping with food  . lisinopril-hydrochlorothiazide (PRINZIDE,ZESTORETIC) 20-12.5 MG tablet Take 1 tablet by mouth daily.  . metoprolol tartrate (LOPRESSOR) 50 MG tablet 1 tab by mouth twice daily  . Multiple Vitamins-Minerals (MULTIVITAMIN WITH MINERALS) tablet Take 1 tablet by mouth daily.  . Omega-3 Fatty Acids (OMEGA-3 FISH OIL PO) Take by mouth daily.    Past Medical History:  Diagnosis Date  . Appendicitis, acute 04/23/2013   Lap appendectomy on  03/23/13  . Dyslipidemia 03/24/2017  . Hyperglycemia 02/17/2017  . Hypertension   . Nodular goiter 03/24/2017  . Thyromegaly 02/17/2017    Past Surgical History:  Procedure Laterality Date  . LAPAROSCOPIC APPENDECTOMY N/A 04/23/2013   Procedure: APPENDECTOMY LAPAROSCOPIC;  Surgeon: Merrie Roof, MD;  Location: [place];  Service: General;  Laterality: N/A;  . TUBAL LIGATION  1997    Family History  Problem Relation Age of Onset  . Arthritis Mother   . Hypertension Mother   . Arthritis Sister   . Diabetes Sister   . Heart disease Brother   . Arthritis Sister   . Headache Daughter    Social History   Socioeconomic History  . Marital status: Married    Spouse name: Reyne Dumas  . Number of children: 3  . Years of education: 1  . Highest education level: Not on file  Occupational History  . Occupation: Arboriculturist for offices  Social Needs  . Financial resource strain: Not very hard  . Food insecurity:    Worry: Never true    Inability: Never true  . Transportation needs:    Medical: No    Non-medical: No  Tobacco Use  . Smoking status: Never Smoker  . Smokeless tobacco: Never Used  Substance and Sexual Activity  .  Alcohol use: No  . Drug use: No  . Sexual activity: Yes    Birth control/protection: Surgical  Lifestyle  . Physical activity:    Days per week: 7 days    Minutes per session: 30 min  . Stress: Not on file  Relationships  . Social connections:    Talks on phone: Not on file    Gets together: Not on file    Attends religious service: Not on file    Active member of club or organization: Not on file    Attends meetings of clubs or organizations: Not on file    Relationship status: Not on file  . Intimate partner violence:    Fear of current or ex partner: No    Emotionally abused: No    Physically abused: No    Forced sexual activity: No  Other Topics Concern  . Not on file  Social History Narrative   Originally from [place]    Came to [place]. In 2006   Lives at home with husband   No Known Allergies Review of Systems  Constitutional: Positive for fatigue. Negative for appetite change.  HENT: Positive for dental problem (Pain in upper right molar.  other teeth are "breaking apart"), ear pain (When has allergies--when nose stuffed up.), sneezing and sore throat. Negative for hearing loss.   Eyes: Positive for visual disturbance (Had eye check in 08/2016.  She wears glasses now.).  Respiratory: Negative for shortness of breath.   Cardiovascular: Positive for chest pain (High upper left chest.  Not very frequently.  Generally at rest.  Mild.  ).  Gastrointestinal: Negative for abdominal pain, blood in stool, constipation and diarrhea.  Genitourinary: Positive for dysuria and frequency.       No period since April until July 5th.  Lasted 3 days, stopped, then spotting for 1 day.  Was not heavy as before.  Musculoskeletal:       Knees feel heavy.  Neurological: Negative for weakness and numbness.  Psychiatric/Behavioral: Negative for dysphoric mood. The patient is not nervous/anxious.        Objective:   Physical Exam  Constitutional: She is oriented to person, place, and time. She appears well-developed and well-nourished.  HENT:  Head: Normocephalic and atraumatic.  Right Ear: Hearing, tympanic membrane, external ear and ear canal normal.  Left Ear: Hearing, tympanic membrane, external ear and ear canal normal.  Nose: Mucosal edema present.  Mouth/Throat: Uvula is midline, oropharynx is clear and moist and mucous membranes are normal.  Dental decay  Eyes: Pupils are equal, round, and reactive to light. Conjunctivae and EOM are normal.  Discs sharp bilaterally  Neck: Normal range of motion and full passive range of motion without pain. Neck supple. No thyroid mass and no thyromegaly present.  Cardiovascular: Normal rate, regular rhythm, S1 normal and S2 normal. Exam reveals no S3, no S4 and no friction rub.  No  murmur heard. No carotid bruits.  Carotid, radial, femoral, DP and PT pulses normal and equal.   Pulmonary/Chest: Effort normal and breath sounds normal. Right breast exhibits no inverted nipple, no mass, no nipple discharge, no skin change and no tenderness. Left breast exhibits no inverted nipple, no mass, no nipple discharge, no skin change and no tenderness.  Abdominal: Soft. Bowel sounds are normal. She exhibits no mass. There is no hepatosplenomegaly. There is no tenderness. No hernia.  Genitourinary: Rectal exam shows no mass and guaiac negative stool.  Genitourinary Comments: Normal external female genitalia.  No uterine or adnexal mass or tenderness.   Uterus retroverted.  Musculoskeletal: Normal range of motion.  Lymphadenopathy:       Head (right side): No submental and no submandibular adenopathy present.       Head (left side): No submental and no submandibular adenopathy present.    She has no cervical adenopathy.    She has no axillary adenopathy.       Right: No inguinal and no supraclavicular adenopathy present.       Left: No inguinal and no supraclavicular adenopathy present.  Neurological: She is alert and oriented to person, place, and time. She has normal strength and normal reflexes. No cranial nerve deficit or sensory deficit. Coordination and gait normal.  Skin: Skin is warm. Capillary refill takes less than 2 seconds. No rash noted.  Psychiatric: She has a normal mood and affect. Her speech is normal and behavior is normal. Judgment and thought content normal. Cognition and memory are normal.        Assessment & Plan:  1.  CPE without pap Guaiac cards to return in 2 weeks Tdap Recommend calling in September to find out flu clinics--likely with orange card sign up next 2 months and [facility]. Schedule mammogram Fasting labs in next 2 weeks with bp check:  FLP, A1C, CBC, CMP  2.  Hypertension:  Switch to Lisinopril/HCTZ 20/25 mg once daily.  Continue  Metoprolol.  3.  Allergies:  Switch to Zyrtec 10 mg daily.

## 2018-03-22 ENCOUNTER — Other Ambulatory Visit: Payer: Self-pay

## 2018-03-24 NOTE — Telephone Encounter (Signed)
Pt. has appt. on [facility] (Ultrasound) [place] [address] [phone] Pt. Confirmed on 03/18/18. With [employer]

## 2018-04-01 ENCOUNTER — Other Ambulatory Visit: Payer: Self-pay

## 2018-04-01 VITALS — BP 138/80 | HR 62

## 2018-04-01 DIAGNOSIS — E785 Hyperlipidemia, unspecified: Secondary | ICD-10-CM

## 2018-04-01 DIAGNOSIS — R739 Hyperglycemia, unspecified: Secondary | ICD-10-CM

## 2018-04-01 DIAGNOSIS — I10 Essential (primary) hypertension: Secondary | ICD-10-CM

## 2018-04-01 DIAGNOSIS — Z79899 Other long term (current) drug therapy: Secondary | ICD-10-CM

## 2018-04-01 NOTE — Progress Notes (Signed)
Patient BP now in normal range. Informed patient to continue current dose of medication. Patient verbalized understanding 

## 2018-04-02 LAB — COMPREHENSIVE METABOLIC PANEL
A/G RATIO: 1.5 (ref 1.2–2.2)
ALT: 14 IU/L (ref 0–32)
AST: 19 IU/L (ref 0–40)
Albumin: 4.1 g/dL (ref 3.5–5.5)
Alkaline Phosphatase: 72 IU/L (ref 39–117)
BUN/Creatinine Ratio: 21 (ref 9–23)
BUN: 11 mg/dL (ref 6–24)
Bilirubin Total: 0.3 mg/dL (ref 0.0–1.2)
CALCIUM: 8.8 mg/dL (ref 8.7–10.2)
CO2: 22 mmol/L (ref 20–29)
Chloride: 104 mmol/L (ref 96–106)
Creatinine, Ser: 0.53 mg/dL — ABNORMAL LOW (ref 0.57–1.00)
GFR calc Af Amer: 130 mL/min/{1.73_m2} (ref >59)
GFR, EST NON AFRICAN AMERICAN: 113 mL/min/{1.73_m2} (ref >59)
GLOBULIN, TOTAL: 2.8 g/dL (ref 1.5–4.5)
Glucose: 105 mg/dL — ABNORMAL HIGH (ref 65–99)
POTASSIUM: 3.7 mmol/L (ref 3.5–5.2)
SODIUM: 140 mmol/L (ref 134–144)
TOTAL PROTEIN: 6.9 g/dL (ref 6.0–8.5)

## 2018-04-02 LAB — CBC WITH DIFFERENTIAL/PLATELET
BASOS: 1 %
Basophils Absolute: 0 10*3/uL (ref 0.0–0.2)
EOS (ABSOLUTE): 0.1 10*3/uL (ref 0.0–0.4)
Eos: 2 %
Hematocrit: 37.4 % (ref 34.0–46.6)
Hemoglobin: 12.3 g/dL (ref 11.1–15.9)
Immature Grans (Abs): 0 10*3/uL (ref 0.0–0.1)
Immature Granulocytes: 0 %
Lymphocytes Absolute: 1.9 10*3/uL (ref 0.7–3.1)
Lymphs: 23 %
MCH: 28 pg (ref 26.6–33.0)
MCHC: 32.9 g/dL (ref 31.5–35.7)
MCV: 85 fL (ref 79–97)
Monocytes Absolute: 0.5 10*3/uL (ref 0.1–0.9)
Monocytes: 6 %
NEUTROS ABS: 5.7 10*3/uL (ref 1.4–7.0)
Neutrophils: 68 %
Platelets: 296 10*3/uL (ref 150–450)
RBC: 4.4 x10E6/uL (ref 3.77–5.28)
RDW: 13.8 % (ref 12.3–15.4)
WBC: 8.3 10*3/uL (ref 3.4–10.8)

## 2018-04-02 LAB — LIPID PANEL W/O CHOL/HDL RATIO
Cholesterol, Total: 157 mg/dL (ref 100–199)
HDL: 46 mg/dL (ref >39)
LDL Calculated: 84 mg/dL (ref 0–99)
Triglycerides: 133 mg/dL (ref 0–149)
VLDL CHOLESTEROL CAL: 27 mg/dL (ref 5–40)

## 2018-04-02 LAB — HGB A1C W/O EAG: HEMOGLOBIN A1C: 5.6 % (ref 4.8–5.6)

## 2018-07-01 ENCOUNTER — Encounter: Payer: Self-pay | Admitting: Internal Medicine

## 2018-07-01 ENCOUNTER — Ambulatory Visit (INDEPENDENT_AMBULATORY_CARE_PROVIDER_SITE_OTHER): Payer: Self-pay | Admitting: Internal Medicine

## 2018-07-01 VITALS — BP 150/90 | HR 68 | Resp 12 | Ht <= 58 in | Wt 161.0 lb

## 2018-07-01 DIAGNOSIS — I10 Essential (primary) hypertension: Secondary | ICD-10-CM

## 2018-07-01 DIAGNOSIS — Z23 Encounter for immunization: Secondary | ICD-10-CM

## 2018-07-01 DIAGNOSIS — F439 Reaction to severe stress, unspecified: Secondary | ICD-10-CM

## 2018-07-01 NOTE — Progress Notes (Signed)
   Subjective:    Patient ID: Alexandra Wu, female    DOB: 1971-04-24, 47 y.o.   MRN: [id number]  HPI   1. Hypertension:  BP way back up today.  She states she is taking her medication.  When asked if she knows why her bp is up she begins to cry and shares her husband has not worked for 3 months.  He is to start with the first of 3 surgeries with complications of gout with complication of osteomyelitis from patient's description. Did check with her pharmacy and she is picking up her medication regularly.  They do not have money to pay water or light bills.  $120 for electricity.  $35 for water.   She would receive food from a food pantry.   Current Meds  Medication Sig  . cetirizine (ZYRTEC) 10 MG tablet Take 1 tablet (10 mg total) by mouth daily.  Marland Kitchen ibuprofen (ADVIL,MOTRIN) 200 MG tablet 2-4 tabs by mouth every 6 hours as needed for cramping with food  . lisinopril-hydrochlorothiazide (PRINZIDE,ZESTORETIC) 20-25 MG tablet Take 1 tablet by mouth daily.  . metoprolol tartrate (LOPRESSOR) 50 MG tablet 1 tab by mouth twice daily  . Multiple Vitamins-Minerals (MULTIVITAMIN WITH MINERALS) tablet Take 1 tablet by mouth daily.  . Omega-3 Fatty Acids (OMEGA-3 FISH OIL PO) Take by mouth daily.   No Known Allergies  Review of Systems     Objective:   Physical Exam  Tearful when discussing she and her husband's situation. Lungs:  CTA CV:  RRR without murmur or rub.  Radial and DP pulses normal and equal Abd:  S, NT, No HSM or mass, + BS LE:  No edema.      Assessment & Plan:  1.  Hypertension:  Likely up due to extreme stressors.  Relaxing in room and will recheck:  Remained high. Will have her return on the day [facility] is here for repeat BP check. She has 2 months of her meds already purchased  2.  Stress due to lack of funds for bills and daily necessities.  Given info on where she can apply for one time utilities coverage and get food.  To call if not  successful.  3.  HM: influenza vaccine

## 2018-07-13 ENCOUNTER — Ambulatory Visit: Payer: Self-pay

## 2018-07-13 VITALS — BP 138/90 | HR 76

## 2018-07-13 DIAGNOSIS — I10 Essential (primary) hypertension: Secondary | ICD-10-CM

## 2018-07-13 NOTE — Progress Notes (Signed)
Patient BP in normal range. informed to continue medication and make sure she is not missing any doses of medication. Patient verbalized understanding.

## 2018-09-09 ENCOUNTER — Other Ambulatory Visit: Payer: Self-pay | Admitting: Internal Medicine

## 2018-09-24 ENCOUNTER — Other Ambulatory Visit: Payer: Self-pay

## 2018-09-24 MED ORDER — METOPROLOL TARTRATE 50 MG PO TABS: ORAL_TABLET | ORAL | 3 refills | Status: DC

## 2018-10-02 ENCOUNTER — Other Ambulatory Visit: Payer: Self-pay | Admitting: Internal Medicine

## 2018-10-14 ENCOUNTER — Ambulatory Visit: Payer: Self-pay | Admitting: Internal Medicine

## 2018-10-14 ENCOUNTER — Encounter: Payer: Self-pay | Admitting: Internal Medicine

## 2018-10-14 ENCOUNTER — Other Ambulatory Visit: Payer: Self-pay

## 2018-10-14 VITALS — BP 170/90 | HR 64 | Resp 12 | Ht <= 58 in | Wt 162.0 lb

## 2018-10-14 DIAGNOSIS — R300 Dysuria: Secondary | ICD-10-CM

## 2018-10-14 DIAGNOSIS — M5441 Lumbago with sciatica, right side: Secondary | ICD-10-CM

## 2018-10-14 LAB — POCT URINALYSIS DIPSTICK
Bilirubin, UA: NEGATIVE
Blood, UA: NEGATIVE
Glucose, UA: NEGATIVE
KETONES UA: NEGATIVE
Leukocytes, UA: NEGATIVE
Nitrite, UA: NEGATIVE
PH UA: 7.5 (ref 5.0–8.0)
Protein, UA: NEGATIVE
UROBILINOGEN UA: 0.2 U/dL

## 2018-10-14 MED ORDER — PREDNISONE 20 MG PO TABS: ORAL_TABLET | ORAL | 0 refills | Status: DC

## 2018-10-14 MED ORDER — CYCLOBENZAPRINE HCL 10 MG PO TABS: ORAL_TABLET | ORAL | 0 refills | Status: DC

## 2018-10-14 NOTE — Progress Notes (Signed)
    Subjective:    Patient ID: Alexandra Wu, female   DOB: May 21, 1971, 48 y.o.   MRN: [id number]   HPI   Eight days ago, started with right low back pain, much lower than the flank area.   No history of acute injury, just awakened with the pain.  Does not recall overuse in days leading up to pain.  She went to a clinic close to home due to the pain. Did not try to call to get an appt here. Went to clinic about 7 days ago.     She states her urine was checked and nothing found abnormal in urine.   She was given an injection for the pain:  She states it was Diclofenac.   She states she was given unknown antibiotic for 5 days. She does not know if it was to cover a possible urine infection, though was told her urine looked fin. States her pain did not improve.   Her pain comes and goes.  Some times not bad, but if walking or any kind of movement, has needles in her right low back and goes around to her upper anterior thigh. She has taken Ibuprofen 800 mg without improvement.   No radiation further down leg.   No weakness of leg.    Does state she is urinating more frequently, but sometimes just a little comes out.  She does not have dysuria.  No constipation.  Other clinic was [facility] on [address].     2.  Hypertension:  States she has not missed her meds, but her bottles are old and suggest otherwise. Unable to make contact with [facility] on [address] to find out her fill history.        Current Meds  Medication Sig  . ibuprofen (ADVIL,MOTRIN) 200 MG tablet 2-4 tabs by mouth every 6 hours as needed for cramping with food  . lisinopril-hydrochlorothiazide (PRINZIDE,ZESTORETIC) 20-12.5 MG tablet Take 1 tablet by mouth once daily  . metoprolol tartrate (LOPRESSOR) 50 MG tablet 1 tab by mouth twice daily  . Multiple Vitamins-Minerals (MULTIVITAMIN WITH MINERALS) tablet Take 1 tablet by mouth daily.  . Omega-3 Fatty Acids (OMEGA-3 FISH OIL PO) Take by  mouth daily.   No Known Allergies   Review of Systems    Objective:   BP (!) 170/90 (BP Location: Left Arm, Patient Position: Sitting, Cuff Size: Normal)   Pulse 64   Resp 12   Ht 4' 8.5" (1.435 m)   Wt 162 lb (73.5 kg)   LMP 09/01/2018   BMI 35.68 kg/m   Physical Exam  NAD Lungs:  CTA CV:  RRR without murmur or rub.  Radial pulses normal and equal. Tender over right sacral paraspinous musculature and down to right buttock. Neuro:  A & O x 3, CN  II-XII grossly intact.  DTRs 2+/4 throughout.  Sensory to light touch normal.  Normal gait.   Assessment & Plan   1.  Right low back pain with right sciatica:  Prednisone 40 mg daily for 5 days.   Cyclobenzaprine 5-10 mg every 8 hours as needed.  Really to use at bedtime.    Call if no improvement.    2.  Hypertension:  Elevation may be partly due to pain. Unable to confirm concern she is not picking up meds again.   Follow up with me in 2 months.

## 2018-12-23 ENCOUNTER — Ambulatory Visit: Payer: Self-pay | Admitting: Internal Medicine

## 2019-09-21 ENCOUNTER — Encounter (HOSPITAL_COMMUNITY): Payer: Self-pay | Admitting: Emergency Medicine

## 2019-09-21 ENCOUNTER — Emergency Department (HOSPITAL_COMMUNITY): Payer: Self-pay

## 2019-09-21 ENCOUNTER — Other Ambulatory Visit: Payer: Self-pay

## 2019-09-21 ENCOUNTER — Emergency Department (HOSPITAL_COMMUNITY)
Admission: EM | Admit: 2019-09-21 | Discharge: 2019-09-22 | Disposition: A | Discharge status: Home / Self Care | Payer: Self-pay | Attending: Emergency Medicine | Admitting: Emergency Medicine

## 2019-09-21 DIAGNOSIS — I10 Essential (primary) hypertension: Secondary | ICD-10-CM | POA: Insufficient documentation

## 2019-09-21 DIAGNOSIS — Z79899 Other long term (current) drug therapy: Secondary | ICD-10-CM | POA: Insufficient documentation

## 2019-09-21 DIAGNOSIS — R519 Headache, unspecified: Secondary | ICD-10-CM | POA: Insufficient documentation

## 2019-09-21 DIAGNOSIS — R0789 Other chest pain: Secondary | ICD-10-CM | POA: Insufficient documentation

## 2019-09-21 LAB — CBC
HCT: 43.1 % (ref 36.0–46.0)
Hemoglobin: 14 g/dL (ref 12.0–15.0)
MCH: 28.6 pg (ref 26.0–34.0)
MCHC: 32.5 g/dL (ref 30.0–36.0)
MCV: 88.1 fL (ref 80.0–100.0)
Platelets: 320 10*3/uL (ref 150–400)
RBC: 4.89 MIL/uL (ref 3.87–5.11)
RDW: 13 % (ref 11.5–15.5)
WBC: 11.9 10*3/uL — ABNORMAL HIGH (ref 4.0–10.5)
nRBC: 0 % (ref 0.0–0.2)

## 2019-09-21 LAB — BASIC METABOLIC PANEL
Anion gap: 10 (ref 5–15)
BUN: 8 mg/dL (ref 6–20)
CO2: 24 mmol/L (ref 22–32)
Calcium: 9.1 mg/dL (ref 8.9–10.3)
Chloride: 104 mmol/L (ref 98–111)
Creatinine, Ser: 0.72 mg/dL (ref 0.44–1.00)
GFR calc Af Amer: >60 mL/min (ref >60)
GFR calc non Af Amer: >60 mL/min (ref >60)
Glucose, Bld: 138 mg/dL — ABNORMAL HIGH (ref 70–99)
Potassium: 2.9 mmol/L — ABNORMAL LOW (ref 3.5–5.1)
Sodium: 138 mmol/L (ref 135–145)

## 2019-09-21 LAB — TROPONIN I (HIGH SENSITIVITY)
Troponin I (High Sensitivity): 3 ng/L (ref <18)
Troponin I (High Sensitivity): 3 ng/L (ref <18)

## 2019-09-21 MED ORDER — SODIUM CHLORIDE 0.9% FLUSH: 3.0000 mL | Freq: Once | INTRAVENOUS | Status: DC

## 2019-09-21 MED ORDER — KETOROLAC TROMETHAMINE 60 MG/2ML IM SOLN
60.0000 mg | Freq: Once | INTRAMUSCULAR | Status: AC
  Administered 2019-09-21: 60 mg via INTRAMUSCULAR
  Filled 2019-09-21: qty 2

## 2019-09-21 MED ORDER — PROMETHAZINE HCL 25 MG/ML IJ SOLN
25.0000 mg | Freq: Once | INTRAMUSCULAR | Status: DC
  Filled 2019-09-21: qty 1

## 2019-09-21 NOTE — ED Provider Notes (Signed)
[facility] EMERGENCY DEPARTMENT Provider Note   CSN: [id number] Arrival date & time: 09/21/19  1735     History Chief Complaint  Patient presents with  . Chest Pain  . Dizziness    Alexandra Wu is a 49 y.o. female.  Patient is a 49 year old female with history of hypertension, hyperlipidemia.  She presents today for evaluation of headache.  She states that she has had pressure in the back of her head for the past 6 days.  This began in the absence of any injury or trauma.  She denies to me she is experiencing any weakness or numbness.  She denies any recent visual changes.  She denies fevers or chills.  She also describes an episode of chest discomfort today that occurred while she was cleaning her house and watching after her 45-year-old child.  This was not associated with any shortness of breath, nausea, diaphoresis, or radiation.  Symptoms lasted for several minutes, then resolved.  She has no prior cardiac history.  The history is provided by the patient.       Past Medical History:  Diagnosis Date  . Appendicitis, acute 04/23/2013   Lap appendectomy on 03/23/13  . Dyslipidemia 03/24/2017  . Hyperglycemia 02/17/2017  . Hypertension   . Nodular goiter 03/24/2017  . Thyromegaly 02/17/2017    Patient Active Problem List   Diagnosis Date Noted  . Nodular goiter 03/24/2017  . Dyslipidemia 03/24/2017  . Hyperglycemia 02/17/2017  . Thyromegaly 02/17/2017  . Appendicitis, acute 04/23/2013  . CANDIDIASIS, VAGINAL 11/15/2007  . MIGRAINE HEADACHE 11/30/2006  . Essential hypertension 11/30/2006  . ALLERGIC RHINITIS 11/30/2006    Past Surgical History:  Procedure Laterality Date  . LAPAROSCOPIC APPENDECTOMY N/A 04/23/2013   Procedure: APPENDECTOMY LAPAROSCOPIC;  Surgeon: Merrie Roof, MD;  Location: [facility];  Service: General;  Laterality: N/A;  . TUBAL LIGATION  1997     OB History   No obstetric history on file.     Family History    Problem Relation Age of Onset  . Arthritis Mother   . Hypertension Mother   . Arthritis Sister   . Diabetes Sister   . Heart disease Brother   . Arthritis Sister   . Headache Daughter     Social History   Tobacco Use  . Smoking status: Never Smoker  . Smokeless tobacco: Never Used  Substance Use Topics  . Alcohol use: No  . Drug use: No    Home Medications Prior to Admission medications   Medication Sig Start Date End Date Taking? Authorizing Provider  cetirizine (ZYRTEC) 10 MG tablet Take 1 tablet (10 mg total) by mouth daily. Patient not taking: Reported on 10/14/2018 03/03/18   Mack Hook, MD  cyclobenzaprine (FLEXERIL) 10 MG tablet 1/2 to 1 tab by mouth every 8 hours as needed for pain 10/14/18   Mack Hook, MD  ibuprofen (ADVIL,MOTRIN) 200 MG tablet 2-4 tabs by mouth every 6 hours as needed for cramping with food 07/09/17   Mack Hook, MD  lisinopril-hydrochlorothiazide Reita May) 20-12.5 MG tablet Take 1 tablet by mouth once daily 10/04/18   Mack Hook, MD  metoprolol tartrate (LOPRESSOR) 50 MG tablet 1 tab by mouth twice daily 09/24/18   Mack Hook, MD  Multiple Vitamins-Minerals (MULTIVITAMIN WITH MINERALS) tablet Take 1 tablet by mouth daily.    [provider]  Omega-3 Fatty Acids (OMEGA-3 FISH OIL PO) Take by mouth daily.    [provider]  predniSONE (DELTASONE) 20  MG tablet 2 tabs by mouth daily for 5 days. 10/14/18   Mack Hook, MD    Allergies    Patient has no known allergies.  Review of Systems   Review of Systems  All other systems reviewed and are negative.   Physical Exam Updated Vital Signs BP (!) 163/86 (BP Location: Left Arm)   Pulse 72   Temp 97.9 F (36.6 C) (Oral)   Resp 18   Wt 76.2 kg   SpO2 100%   BMI 37.00 kg/m   Physical Exam Vitals and nursing note reviewed.  Constitutional:      General: She is not in acute distress.    Appearance: She is  well-developed. She is not diaphoretic.  HENT:     Head: Normocephalic and atraumatic.  Eyes:     Extraocular Movements: Extraocular movements intact.     Pupils: Pupils are equal, round, and reactive to light.  Cardiovascular:     Rate and Rhythm: Normal rate and regular rhythm.     Heart sounds: No murmur. No friction rub. No gallop.   Pulmonary:     Effort: Pulmonary effort is normal. No respiratory distress.     Breath sounds: Normal breath sounds. No wheezing.  Abdominal:     General: Bowel sounds are normal. There is no distension.     Palpations: Abdomen is soft.     Tenderness: There is no abdominal tenderness.  Musculoskeletal:        General: Normal range of motion.     Cervical back: Normal range of motion and neck supple.     Right lower leg: No edema.     Left lower leg: No edema.  Skin:    General: Skin is warm and dry.  Neurological:     General: No focal deficit present.     Mental Status: She is alert and oriented to person, place, and time.     Cranial Nerves: No cranial nerve deficit.     Motor: No weakness.     ED Results / Procedures / Treatments   Labs (all labs ordered are listed, but only abnormal results are displayed) Labs Reviewed  BASIC METABOLIC PANEL - Abnormal; Notable for the following components:      Result Value   Potassium 2.9 (*)    Glucose, Bld 138 (*)    All other components within normal limits  CBC - Abnormal; Notable for the following components:   WBC 11.9 (*)    All other components within normal limits  TROPONIN I (HIGH SENSITIVITY)  TROPONIN I (HIGH SENSITIVITY)    EKG EKG Interpretation  Date/Time:  Wednesday September 21 2019 18:11:11 EST Ventricular Rate:  79 PR Interval:  162 QRS Duration: 106 QT Interval:  424 QTC Calculation: 486 R Axis:   65 Text Interpretation: Normal sinus rhythm Prolonged QT Abnormal ECG No old tracing to compare Confirmed by Sherwood Gambler [phone]) on 09/21/2019 10:43:34  PM   Radiology DG Chest 2 View  Result Date: 09/21/2019 CLINICAL DATA:  Central chest pain and dizziness for 5 days, hypertension EXAM: CHEST - 2 VIEW COMPARISON:  None. FINDINGS: The heart size and mediastinal contours are within normal limits. Both lungs are clear. The visualized skeletal structures are unremarkable. IMPRESSION: No active cardiopulmonary disease. Electronically Signed   By: Randa Ngo M.D.   On: 09/21/2019 19:00    Procedures Procedures (including critical care time)  Medications Ordered in ED Medications  sodium chloride flush (NS) 0.9 % injection 3 mL (  has no administration in time range)  ketorolac (TORADOL) injection 60 mg (has no administration in time range)  promethazine (PHENERGAN) injection 25 mg (has no administration in time range)    ED Course  I have reviewed the triage vital signs and the nursing notes.  Pertinent labs & imaging results that were available during my care of the patient were reviewed by me and considered in my medical decision making (see chart for details).    MDM Rules/Calculators/A&P  Patient presenting here with complaints of headache and chest discomfort as described in the HPI.  Patient's work-up is unremarkable and vital signs are stable.  Her physical examination reveals no focal deficits or obvious cause.  Patient feeling better after Toradol and Phenergan.  At this point, I feel as though discharge is appropriate.  She will be given tramadol she can take as needed if her headache is not relieved with ibuprofen and is to follow-up if not improving.  Final Clinical Impression(s) / ED Diagnoses Final diagnoses:  None    Rx / DC Orders ED Discharge Orders    None       Veryl Speak, MD 09/22/19 [phone]

## 2019-09-21 NOTE — ED Notes (Signed)
Pt reports pain when breathing in and stated that she felt tightness when I listened to her lungs as she took deep breaths. No nausea or vomit. Lungs sound clear

## 2019-09-21 NOTE — ED Triage Notes (Signed)
Pt arrives to ED from her PCP with complaints of centralized chest pain, vertigo in the mornings, headaches, and a burning sensation to the left side of her face and jaw. Patient states when she yawns she gets extreme headaches.

## 2019-09-22 ENCOUNTER — Emergency Department (HOSPITAL_COMMUNITY): Payer: Self-pay

## 2019-09-22 MED ORDER — TRAMADOL HCL 50 MG PO TABS: 50.0000 mg | ORAL_TABLET | Freq: Four times a day (QID) | ORAL | 0 refills | Status: DC | PRN

## 2019-09-22 NOTE — Discharge Instructions (Signed)
Take ibuprofen 600 mg every 6 hours as needed for pain.  Begin taking tramadol as prescribed as needed for pain not relieved with ibuprofen.  Follow-up with your primary doctor if symptoms are not improving in the next few days, and return to the ER if you develop worsening pain, difficulty breathing, or other new and concerning symptoms.

## 2020-04-12 ENCOUNTER — Encounter: Payer: Self-pay | Admitting: Gastroenterology

## 2020-06-07 ENCOUNTER — Encounter: Payer: Self-pay | Admitting: Gastroenterology

## 2020-06-29 ENCOUNTER — Encounter: Payer: Self-pay | Admitting: Gastroenterology

## 2020-07-11 IMAGING — CT CT HEAD W/O CM
4 series · 17 of 47 positions shown, 19 images · non-contrast
Comparison: None.

CLINICAL DATA: Vertigo, headaches

EXAM:
CT HEAD WITHOUT CONTRAST
TECHNIQUE: Contiguous axial images were obtained from the base of the skull
through the vertex without intravenous contrast.

[Series 3: head wo · axial · 0.39mm/px · z∈[+1115,+1235]mm · 7 of 33 slices shown, 9 images]
[im 5/33  brain]
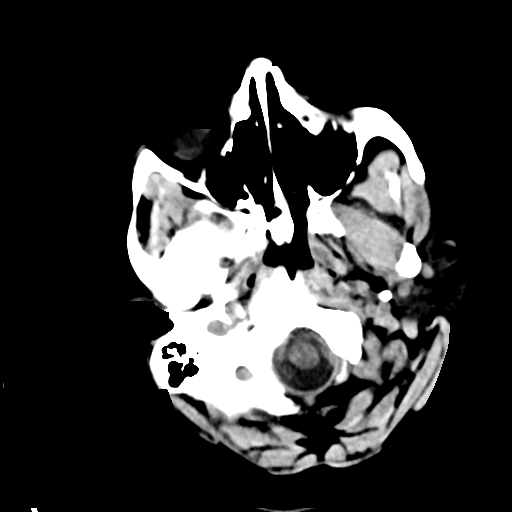
[im 5/33  bone]
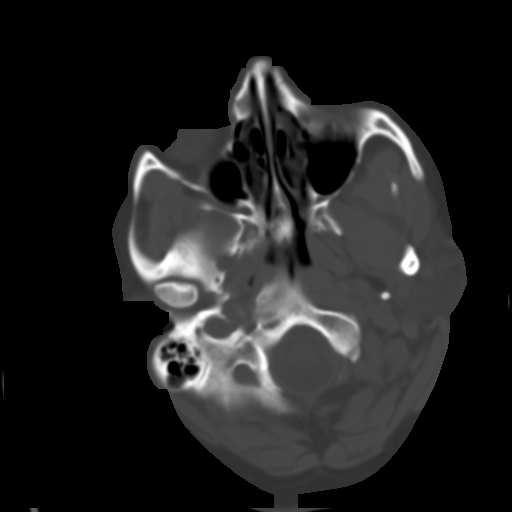
[im 9/33  brain]
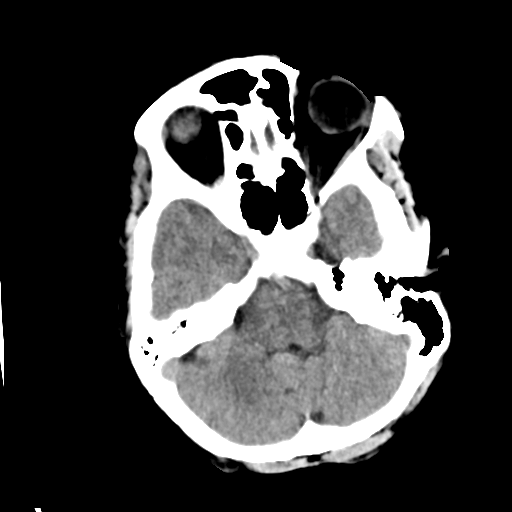
[im 13/33  brain]
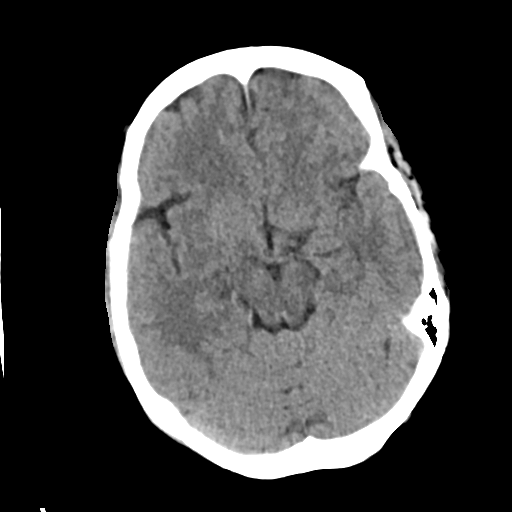
[im 17/33  brain]
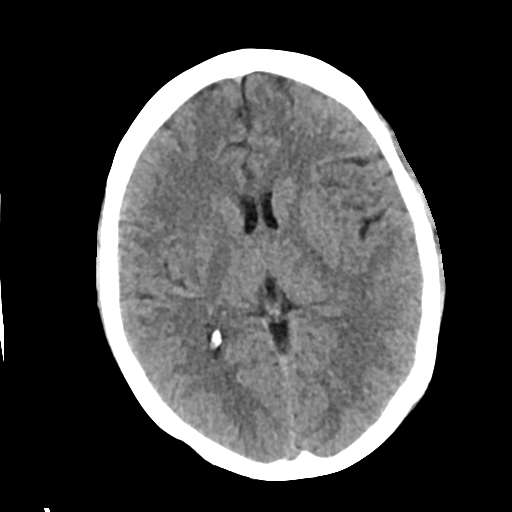
[im 21/33  brain]
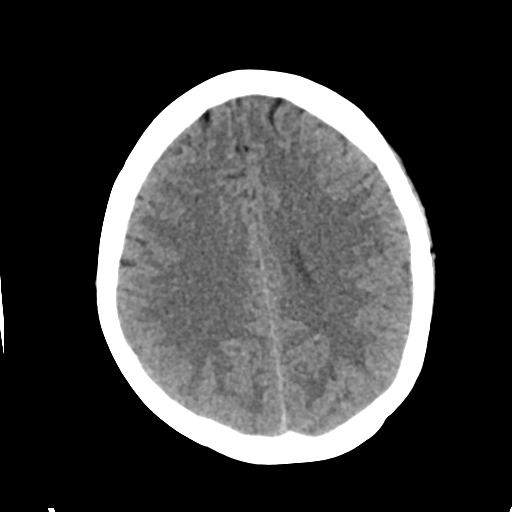
[im 21/33  bone]
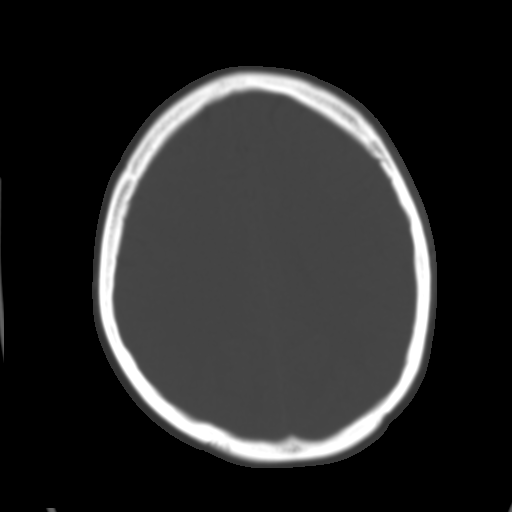
[im 25/33  brain]
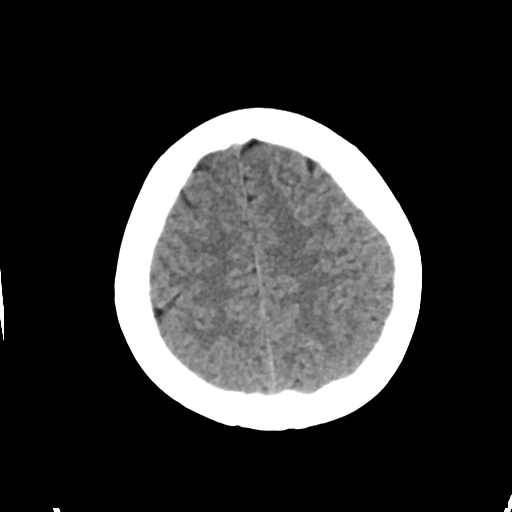
[im 29/33  brain]
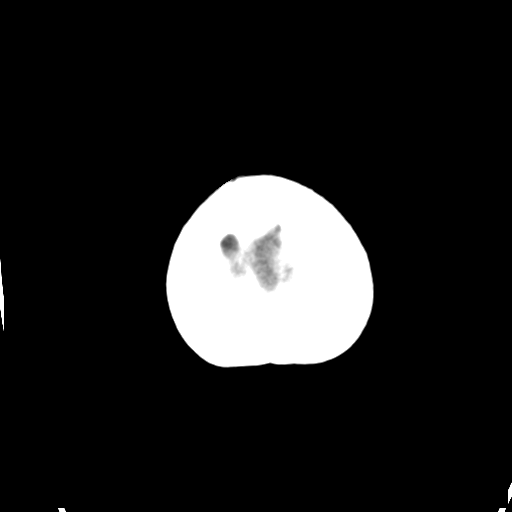

[Series 4: head bone · axial · 0.39mm/px · z∈[+1111,+1167]mm · 4 of 82 slices shown]
[im 9/82  bone]
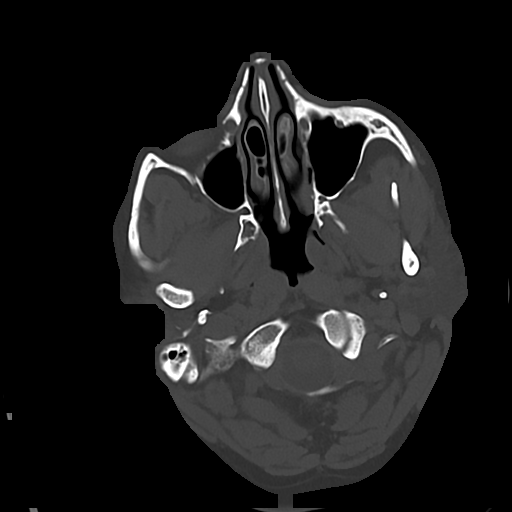
[im 17/82  bone]
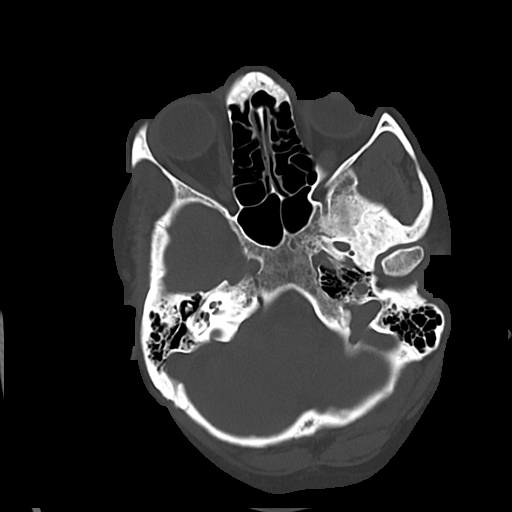
[im 25/82  bone]
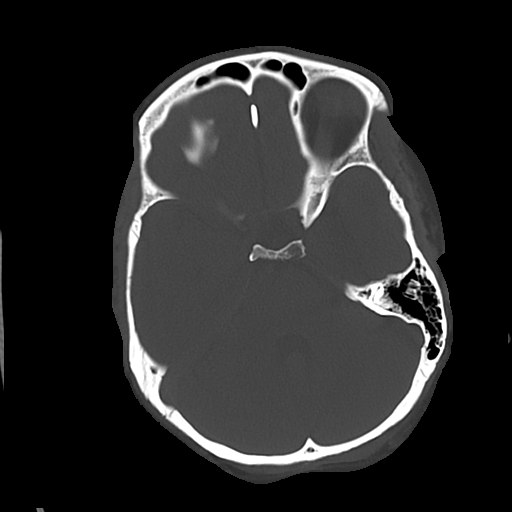
[im 37/82  bone]
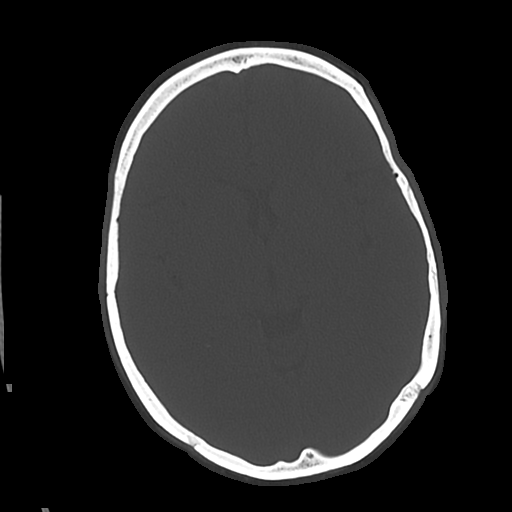

[Series 5: cor soft · coronal · 0.34mm/px · 3 of 63 slices shown]
[im 21/63  brain]
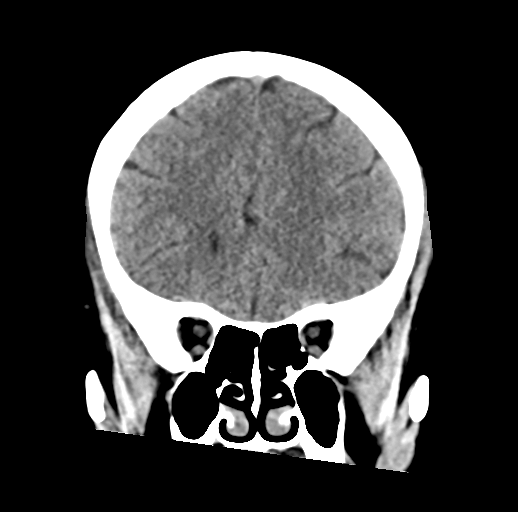
[im 28/63  brain]
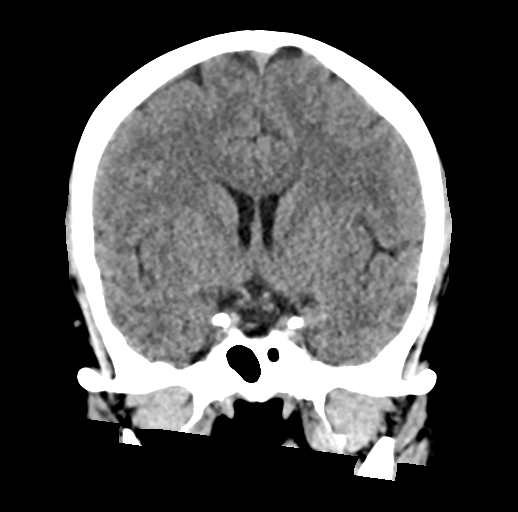
[im 35/63  brain]
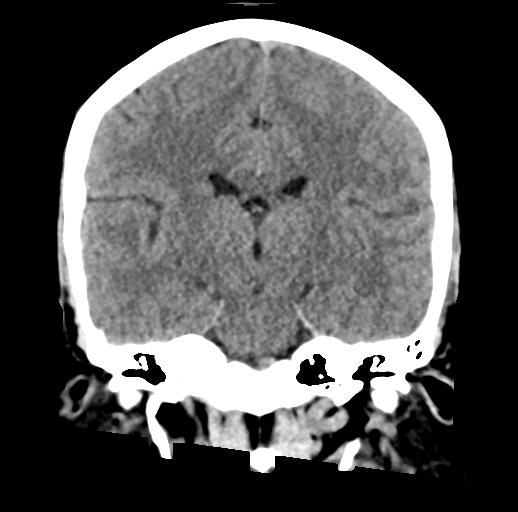

[Series 6: sag soft · sagittal · 0.34mm/px · 3 of 54 slices shown]
[im 19/54  brain]
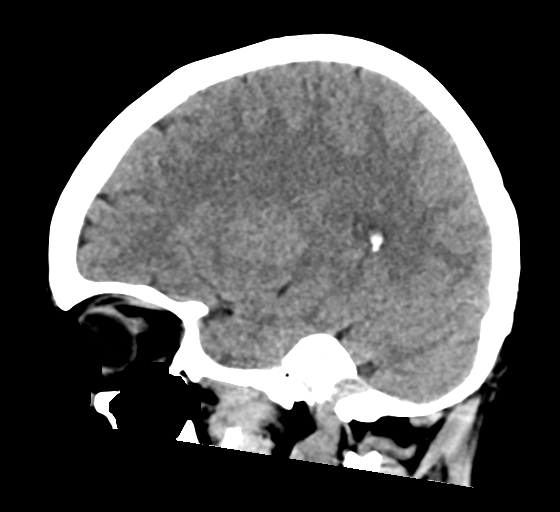
[im 27/54  brain]
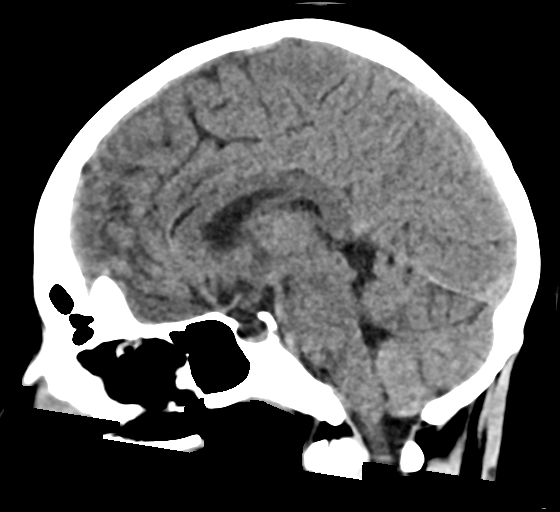
[im 35/54  brain]
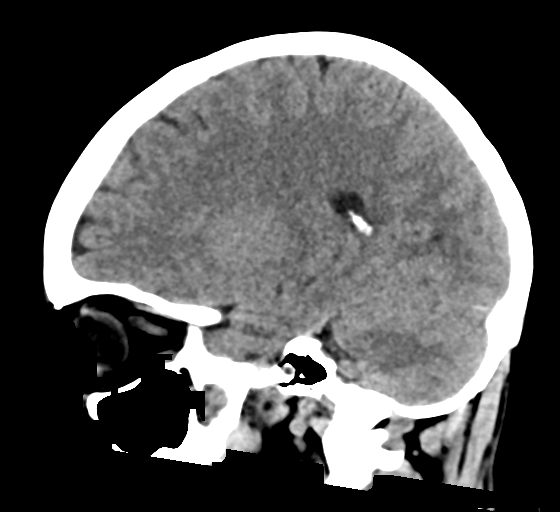

[17 of 47 positions shown; findings below may reference images not displayed]

FINDINGS: Brain: No acute infarct or hemorrhage. Lateral ventricles and
midline structures are unremarkable. No acute extra-axial fluid
collections. No mass effect.

Vascular: No hyperdense vessel or unexpected calcification.

Skull: Normal. Negative for fracture or focal lesion.

Sinuses/Orbits: No acute finding.

Other: None
IMPRESSION: 1. No acute intracranial process.

## 2020-11-28 ENCOUNTER — Ambulatory Visit (INDEPENDENT_AMBULATORY_CARE_PROVIDER_SITE_OTHER): Payer: 59 | Admitting: Neurology

## 2020-11-28 ENCOUNTER — Encounter: Payer: Self-pay | Admitting: Neurology

## 2020-11-28 VITALS — BP 168/102 | HR 72 | Ht 60.0 in | Wt 164.0 lb

## 2020-11-28 DIAGNOSIS — I10 Essential (primary) hypertension: Secondary | ICD-10-CM

## 2020-11-28 DIAGNOSIS — E669 Obesity, unspecified: Secondary | ICD-10-CM

## 2020-11-28 DIAGNOSIS — R0683 Snoring: Secondary | ICD-10-CM

## 2020-11-28 DIAGNOSIS — R351 Nocturia: Secondary | ICD-10-CM

## 2020-11-28 DIAGNOSIS — R030 Elevated blood-pressure reading, without diagnosis of hypertension: Secondary | ICD-10-CM

## 2020-11-28 DIAGNOSIS — R519 Headache, unspecified: Secondary | ICD-10-CM

## 2020-11-28 DIAGNOSIS — E66811 Obesity, class 1: Secondary | ICD-10-CM

## 2020-11-28 NOTE — Progress Notes (Signed)
Subjective:    Patient ID: Dot Been de Lyn Records is a 50 y.o. female.  HPI     Star Age, MD, PhD [facility] [address]O. [place], [place] [address]  Dear Leatrice Jewels,   I saw your patient, Deidrea Gaetz, upon your kind request, in my sleep clinic today for initial consultation of her sleep disorder, in particular, concern for underlying obstructive sleep apnea.  The patient is accompanied by Abby, her daughter-in-law today.  A Spanish interpreter is also here for the visit but her daughter-in-law is also facilitating communication as she speaks [place] well.  As you know, Ms. Debroah Baller is a 50 year old right-handed woman with an underlying medical history of hypertension, hyperlipidemia, pre-diabetes, thyromegaly, and obesity, who reports snoring and excessive daytime somnolence.  She has had difficult to control blood pressure.  In fact, when she first was triaged, her blood pressure was rather high.  Upon recheck several minutes later it was 168/102, still high.  I reviewed your office note from 10/02/20.  Her Epworth sleepiness score is 2 out of 24, fatigue severity score is 21 out of 63.  She does snore per family.  She lives with her husband.  Her children are grown.  She has no family history of sleep apnea.  She had weight gain in the realm of 10 pounds in the past 4 months.  She has had a difficult to control blood pressure and also checks it at home, and is often elevated in the 283T for the systolic and higher.  She has had quite a few changes in her medication regimen.  Bedtime is generally around 10 and rise time typically around 6.  She has nocturia about once or twice per average night and has had the occasional morning headache.  Her blood pressure tends to be higher in the mornings.  She works part-time in the afternoons.  She drinks caffeine in the form of coffee, 1 cup in the mornings typically.  She is a  non-smoker and does not drink alcohol.  She had recent labs through your office about a month ago.    Her Past Medical History Is Significant For: Past Medical History:  Diagnosis Date  . Appendicitis, acute 04/23/2013   Lap appendectomy on 03/23/13  . Dyslipidemia 03/24/2017  . Hyperglycemia 02/17/2017  . Hypertension   . Nodular goiter 03/24/2017  . Thyromegaly 02/17/2017    Her Past Surgical History Is Significant For: Past Surgical History:  Procedure Laterality Date  . LAPAROSCOPIC APPENDECTOMY N/A 04/23/2013   Procedure: APPENDECTOMY LAPAROSCOPIC;  Surgeon: Merrie Roof, MD;  Location: [facility];  Service: General;  Laterality: N/A;  . [place]    Her Family History Is Significant For: Family History  Problem Relation Age of Onset  . Arthritis Mother   . Hypertension Mother   . Arthritis Sister   . Diabetes Sister   . Heart disease Brother   . Arthritis Sister   . Headache Daughter     Her Social History Is Significant For: Social History   Socioeconomic History  . Marital status: Married    Spouse name: Reyne Dumas  . Number of children: 3  . Years of education: 1  . Highest education level: Not on file  Occupational History  . Occupation: Arboriculturist for offices  Tobacco Use  . Smoking status: Never Smoker  . Smokeless tobacco: Never Used  Vaping Use  . Vaping Use: Never  used  Substance and Sexual Activity  . Alcohol use: No  . Drug use: No  . Sexual activity: Yes    Birth control/protection: Surgical  Other Topics Concern  . Not on file  Social History Narrative   Originally from [place]   Came to [place]. In 2006   Lives at home with husband   Social Determinants of Health   Financial Resource Strain: Not on file  Food Insecurity: Not on file  Transportation Needs: Not on file  Physical Activity: Not on file  Stress: Not on file  Social Connections: Not on file    Her Allergies Are:  No Known Allergies:   Her  Current Medications Are:  Outpatient Encounter Medications as of 11/28/2020  Medication Sig  . cetirizine (ZYRTEC) 10 MG tablet Take 1 tablet (10 mg total) by mouth daily.  . cyclobenzaprine (FLEXERIL) 10 MG tablet 1/2 to 1 tab by mouth every 8 hours as needed for pain  . ibuprofen (ADVIL,MOTRIN) 200 MG tablet 2-4 tabs by mouth every 6 hours as needed for cramping with food  . lisinopril-hydrochlorothiazide (PRINZIDE,ZESTORETIC) 20-12.5 MG tablet Take 1 tablet by mouth once daily  . metoprolol tartrate (LOPRESSOR) 50 MG tablet 1 tab by mouth twice daily  . Multiple Vitamins-Minerals (MULTIVITAMIN WITH MINERALS) tablet Take 1 tablet by mouth daily.  . Omega-3 Fatty Acids (OMEGA-3 FISH OIL PO) Take by mouth daily.  . predniSONE (DELTASONE) 20 MG tablet 2 tabs by mouth daily for 5 days.  . traMADol (ULTRAM) 50 MG tablet Take 1 tablet (50 mg total) by mouth every 6 (six) hours as needed.   No facility-administered encounter medications on file as of 11/28/2020.  :   Review of Systems:  Out of a complete 14 point review of systems, all are reviewed and negative with the exception of these symptoms as listed below:  Review of Systems  Neurological:       Here for sleep consult. No prior sleep study snoring is present.  Epworth Sleepiness Scale 0= would never doze 1= slight chance of dozing 2= moderate chance of dozing 3= high chance of dozing  Sitting and reading:0 Watching TV:1 Sitting inactive in a public place (ex. Theater or meeting):0 As a passenger in a car for an hour without a break:0 Lying down to rest in the afternoon:1 Sitting and talking to someone:0 Sitting quietly after lunch (no alcohol):0 In a car, while stopped in traffic:0 Total:2     Objective:  Neurological Exam  Physical Exam Physical Examination:   Vitals:   11/28/20 1521  BP: (!) 168/102  Pulse: 72    General Examination: The patient is a very pleasant 50 y.o. female in no acute distress. She appears  well-developed and well-nourished and well groomed.  She denies any significant chest pain, shortness of breath, blurry vision or headache currently.  HEENT: Normocephalic, atraumatic, pupils are equal, round and reactive to light, extraocular tracking is good without limitation to gaze excursion or nystagmus noted. Hearing is grossly intact. Face is symmetric with normal facial animation. Speech is clear with no dysarthria noted. There is no hypophonia. There is no lip, neck/head, jaw or voice tremor. Neck is supple with full range of passive and active motion. There are no carotid bruits on auscultation. Oropharynx exam reveals: mild mouth dryness, good dental hygiene and mild airway crowding, due to small airway entry, tonsils and uvula not fully visualized, Mallampati class III, neck circumference 15 inches.  She has a minimal overbite.  Tongue  protrudes centrally and palate elevates symmetrically.  Chest: Clear to auscultation without wheezing, rhonchi or crackles noted.  Heart: S1+S2+0, regular and normal without murmurs, rubs or gallops noted.   Abdomen: Soft, non-tender and non-distended with normal bowel sounds appreciated on auscultation.  Extremities: There is no edema in the distal lower extremities bilaterally.   Skin: Warm and dry without trophic changes noted.   Musculoskeletal: exam reveals no obvious joint deformities, tenderness or joint swelling or erythema.   Neurologically:  Mental status: The patient is awake, alert and oriented in all 4 spheres. Her immediate and remote memory, attention, language skills and fund of knowledge are appropriate. There is no evidence of aphasia, agnosia, apraxia or anomia. Speech is clear with normal prosody and enunciation. Thought process is linear. Mood is normal and affect is normal.  Cranial nerves II - XII are as described above under HEENT exam.  Motor exam: Normal bulk, strength and tone is noted. There is no tremor. Fine motor skills  and coordination: grossly intact.  Cerebellar testing: No dysmetria or intention tremor. There is no truncal or gait ataxia.  Sensory exam: intact to light touch in the upper and lower extremities.  Gait, station and balance: She stands easily. No veering to one side is noted. No leaning to one side is noted. Posture is age-appropriate and stance is narrow based. Gait shows normal stride length and normal pace. No problems turning are noted.   Assessment and Plan:   In summary, [place] emmi wertheim is a very pleasant 50 y.o.-year old female  with an underlying medical history of hypertension, hyperlipidemia, pre-diabetes, thyromegaly, and obesity, whose history and physical exam are concerning for obstructive sleep apnea (OSA). I had a long chat with the patient and her young man we had I really liked him and he seemed about my findings and the diagnosis of OSA, its prognosis and treatment options. We talked about medical treatments, surgical interventions and non-pharmacological approaches. I explained in particular the risks and ramifications of untreated moderate to severe OSA, especially with respect to developing cardiovascular disease down the Road, including congestive heart failure, difficult to treat hypertension, cardiac arrhythmias, or stroke. Even type 2 diabetes has, in part, been linked to untreated OSA. Symptoms of untreated OSA include daytime sleepiness, memory problems, mood irritability and mood disorder such as depression and anxiety, lack of energy, as well as recurrent headaches, especially morning headaches. We talked about trying to maintain a healthy lifestyle in general, as well as the importance of weight control. We also talked about the importance of good sleep hygiene. I recommended the following at this time: sleep study.   I explained the sleep test procedure to the patient and also outlined possible surgical and non-surgical treatment options of OSA, including the  use of a custom-made dental device (which would require a referral to a specialist dentist or oral surgeon), upper airway surgical options, such as traditional UPPP or a novel less invasive surgical option in the form of Inspire hypoglossal nerve stimulation (which would involve a referral to an ENT surgeon). I also explained the CPAP treatment option to the patient, who indicated that she would be willing to try CPAP if the need arises.  We will plan a follow-up after sleep testing.  We will keep him posted by phone call about the results in the interim as well.  I answered all the questions today and the patient and her daughter-in-law were in agreement with the plan. Thank you very much  for allowing me to participate in the care of this nice patient. If I can be of any further assistance to you please do not hesitate to call me at [phone].  Sincerely,   Star Age, MD, PhD

## 2020-11-28 NOTE — Patient Instructions (Signed)

## 2022-01-05 ENCOUNTER — Ambulatory Visit
Admission: RE | Admit: 2022-01-05 | Discharge: 2022-01-05 | Disposition: A | Discharge status: Home / Self Care | Payer: 59 | Source: Ambulatory Visit

## 2022-01-05 VITALS — BP 168/108 | HR 68 | Temp 97.6°F | Resp 20

## 2022-01-05 DIAGNOSIS — I10 Essential (primary) hypertension: Secondary | ICD-10-CM

## 2022-01-05 DIAGNOSIS — M5442 Lumbago with sciatica, left side: Secondary | ICD-10-CM

## 2022-01-05 MED ORDER — PREDNISONE 20 MG PO TABS: ORAL_TABLET | ORAL | 0 refills | Status: DC

## 2022-01-05 MED ORDER — TIZANIDINE HCL 4 MG PO TABS: 4.0000 mg | ORAL_TABLET | Freq: Every day | ORAL | 0 refills | Status: DC

## 2022-01-05 NOTE — ED Provider Notes (Signed)
[facility]   MRN: [id number] DOB: 1970-10-11  Subjective:   Alexandra Wu is a 51 y.o. female presenting for 1 week history of persistent and worsening low back pain that radiates toward the left hip, buttock and down her leg all the way to her foot. No fall, trauma, numbness or tingling, saddle paresthesia, changes to bowel or urinary habits. Regarding her blood pressure, patient did not take her blood pressure medications today.  Denies headache, confusion, weakness.  No history of stroke.   No current facility-administered medications for this encounter.  Current Outpatient Medications:    losartan (COZAAR) 100 MG tablet, Take 100 mg by mouth daily., Disp: , Rfl:    spironolactone (ALDACTONE) 50 MG tablet, Take 50 mg by mouth daily., Disp: , Rfl:    cetirizine (ZYRTEC) 10 MG tablet, Take 1 tablet (10 mg total) by mouth daily., Disp: 30 tablet, Rfl: 11   ibuprofen (ADVIL,MOTRIN) 200 MG tablet, 2-4 tabs by mouth every 6 hours as needed for cramping with food, Disp: 30 tablet, Rfl: 0   metoprolol tartrate (LOPRESSOR) 50 MG tablet, 1 tab by mouth twice daily, Disp: 180 tablet, Rfl: 3   Multiple Vitamins-Minerals (MULTIVITAMIN WITH MINERALS) tablet, Take 1 tablet by mouth daily., Disp: , Rfl:    Omega-3 Fatty Acids (OMEGA-3 FISH OIL PO), Take by mouth daily., Disp: , Rfl:    No Known Allergies  Past Medical History:  Diagnosis Date   Appendicitis, acute 04/23/2013   Lap appendectomy on 03/23/13   Dyslipidemia 03/24/2017   Hyperglycemia 02/17/2017   Hypertension    Nodular goiter 03/24/2017   Thyromegaly 02/17/2017     Past Surgical History:  Procedure Laterality Date   LAPAROSCOPIC APPENDECTOMY N/A 04/23/2013   Procedure: APPENDECTOMY LAPAROSCOPIC;  Surgeon: Luella Cook III, MD;  Location: [facility];  Service: General;  Laterality: N/A;   TUBAL LIGATION  1997    Family History  Problem Relation Age of Onset   Arthritis Mother    Hypertension  Mother    Arthritis Sister    Diabetes Sister    Heart disease Brother    Arthritis Sister    Headache Daughter     Social History   Tobacco Use   Smoking status: Never   Smokeless tobacco: Never  Vaping Use   Vaping Use: Never used  Substance Use Topics   Alcohol use: No   Drug use: No    ROS   Objective:   Vitals: BP (!) 168/108 (BP Location: Left Arm) Comment: pt states she has not taken her BP med today, provider aware  Pulse 68   Temp 97.6 F (36.4 C) (Oral)   Resp 20   LMP 09/01/2018   SpO2 97%   BP Readings from Last 3 Encounters:  01/05/22 (!) 168/108  11/28/20 (!) 168/102  09/22/19 130/80   Physical Exam Constitutional:      General: She is not in acute distress.    Appearance: Normal appearance. She is well-developed. She is not ill-appearing, toxic-appearing or diaphoretic.  HENT:     Head: Normocephalic and atraumatic.     Nose: Nose normal.     Mouth/Throat:     Mouth: Mucous membranes are moist.  Eyes:     General: No scleral icterus.       Right eye: No discharge.        Left eye: No discharge.     Extraocular Movements: Extraocular movements intact.  Cardiovascular:  Rate and Rhythm: Normal rate.  Pulmonary:     Effort: Pulmonary effort is normal.  Musculoskeletal:     Lumbar back: No swelling, edema, deformity, signs of trauma, lacerations, spasms, tenderness or bony tenderness. Normal range of motion. Negative right straight leg raise test and negative left straight leg raise test. No scoliosis.     Comments: Full range of motion throughout.  Strength 5/5 for upper and lower extremities.  Patient ambulates without any assistance at expected pace.  No ecchymosis, swelling, lacerations or abrasions.  Patient does have paraspinal muscle tenderness along the left lumbar region of her back excluding the midline.  Slightly positive straight leg raise on the left.    Skin:    General: Skin is warm and dry.  Neurological:     General: No  focal deficit present.     Mental Status: She is alert and oriented to person, place, and time.     Motor: No weakness.     Coordination: Coordination normal.     Gait: Gait normal.     Deep Tendon Reflexes: Reflexes normal.     Comments: Negative Romberg and pronator drift, no facial asymmetry.  Psychiatric:        Mood and Affect: Mood normal.        Behavior: Behavior normal.      Assessment and Plan :   PDMP not reviewed this encounter.  1. Acute left-sided low back pain with left-sided sciatica   2. Essential hypertension    Offered x-rays but patient is in agreement that we will hold off for now.  Recommended an oral prednisone course to address her sciatica.  Use tizanidine and Tylenol otherwise.  Emphasized need for compliance with her blood pressure medications.  No signs of an acute spinal emergency, acute encephalopathy related to her blood pressure. Counseled patient on potential for adverse effects with medications prescribed/recommended today, ER and return-to-clinic precautions discussed, patient verbalized understanding.    Jaynee Eagles, PA-C 01/05/22 1056

## 2022-01-05 NOTE — ED Triage Notes (Signed)
Pt c/o left lower back pain that radiates down left hip pain (inflammation) for about a week that is progressively getting worse.

## 2022-05-06 ENCOUNTER — Encounter: Payer: Self-pay | Admitting: Family Medicine

## 2022-05-06 ENCOUNTER — Ambulatory Visit: Payer: 59 | Attending: Family Medicine | Admitting: Family Medicine

## 2022-05-06 VITALS — BP 168/96 | HR 74 | Temp 98.4°F | Ht 60.0 in | Wt 168.4 lb

## 2022-05-06 DIAGNOSIS — Z131 Encounter for screening for diabetes mellitus: Secondary | ICD-10-CM

## 2022-05-06 DIAGNOSIS — Z1159 Encounter for screening for other viral diseases: Secondary | ICD-10-CM

## 2022-05-06 DIAGNOSIS — D219 Benign neoplasm of connective and other soft tissue, unspecified: Secondary | ICD-10-CM | POA: Diagnosis not present

## 2022-05-06 DIAGNOSIS — Z23 Encounter for immunization: Secondary | ICD-10-CM

## 2022-05-06 DIAGNOSIS — I10 Essential (primary) hypertension: Secondary | ICD-10-CM | POA: Diagnosis not present

## 2022-05-06 DIAGNOSIS — G8929 Other chronic pain: Secondary | ICD-10-CM | POA: Diagnosis not present

## 2022-05-06 DIAGNOSIS — M25512 Pain in left shoulder: Secondary | ICD-10-CM

## 2022-05-06 MED ORDER — SPIRONOLACTONE 50 MG PO TABS: 50.0000 mg | ORAL_TABLET | Freq: Every day | ORAL | 1 refills | Status: DC

## 2022-05-06 MED ORDER — TIZANIDINE HCL 4 MG PO TABS: 4.0000 mg | ORAL_TABLET | Freq: Three times a day (TID) | ORAL | 1 refills | Status: DC | PRN

## 2022-05-06 MED ORDER — MELOXICAM 7.5 MG PO TABS: 7.5000 mg | ORAL_TABLET | Freq: Every day | ORAL | 1 refills | Status: DC

## 2022-05-06 MED ORDER — LOSARTAN POTASSIUM 100 MG PO TABS: 100.0000 mg | ORAL_TABLET | Freq: Every day | ORAL | 1 refills | Status: DC

## 2022-05-06 MED ORDER — METOPROLOL TARTRATE 50 MG PO TABS: ORAL_TABLET | ORAL | 1 refills | Status: DC

## 2022-05-06 NOTE — Progress Notes (Signed)
Subjective:  Patient ID: Alexandra Wu, female    DOB: 09/17/1970  Age: 51 y.o. MRN: [id number]  CC: New Patient (Initial Visit)   HPI Alexandra Wu is a 51 y.o. year old female with a history of hypertension who presents today to establish care. Seen with the aid of an in person Spanish interpreter  Interval History: She complains of left shoulder pain and fibroids. Pain in left shoulder has been present x  12 months worse at night with difficulty lifting a gallon of milk, painful overhead motion. Associated numbness and tingling.Tylenol does not provide relief as pain can sometimes be moderate. She is right hand dominant.  She has  a h/o Fibroids which were previously surgically removed but then it reoccurred. She has has vaginal burning, throbbing sensation in her vagina.  Her chart reveals history of goiter but she is unaware of such a history  Her BP is elevated and she is yet to take her antihypertensive as she is fasting and finds her medications too strong on an empty stomach . Past Medical History:  Diagnosis Date   Appendicitis, acute 04/23/2013   Lap appendectomy on 03/23/13   Dyslipidemia 03/24/2017   Hyperglycemia 02/17/2017   Hypertension    Nodular goiter 03/24/2017   Thyromegaly 02/17/2017    Past Surgical History:  Procedure Laterality Date   LAPAROSCOPIC APPENDECTOMY N/A 04/23/2013   Procedure: APPENDECTOMY LAPAROSCOPIC;  Surgeon: Merrie Roof, MD;  Location: [facility] OR;  Service: General;  Laterality: N/A;   TUBAL LIGATION  1997    Family History  Problem Relation Age of Onset   Arthritis Mother    Hypertension Mother    Arthritis Sister    Diabetes Sister    Heart disease Brother    Arthritis Sister    Headache Daughter     Social History   Socioeconomic History   Marital status: Married    Spouse name: Scientist, research (medical)   Number of children: 3   Years of education: 1   Highest education level: Not on file   Occupational History   Occupation: Arboriculturist for offices  Tobacco Use   Smoking status: Never   Smokeless tobacco: Never  Vaping Use   Vaping Use: Never used  Substance and Sexual Activity   Alcohol use: No   Drug use: No   Sexual activity: Yes    Birth control/protection: Surgical  Other Topics Concern   Not on file  Social History Narrative   Originally from [place]   Came to [place]. In 2006   Lives at home with husband   Social Determinants of Health   Financial Resource Strain: Low Risk  (03/03/2018)   Overall Financial Resource Strain (CARDIA)    Difficulty of Paying Living Expenses: Not very hard  Food Insecurity: No Food Insecurity (03/03/2018)   Hunger Vital Sign    Worried About Running Out of Food in the Last Year: Never true    Ran Out of Food in the Last Year: Never true  Transportation Needs: No Transportation Needs (03/03/2018)   PRAPARE - Hydrologist (Medical): No    Lack of Transportation (Non-Medical): No  Physical Activity: Sufficiently Active (03/03/2018)   Exercise Vital Sign    Days of Exercise per Week: 7 days    Minutes of Exercise per Session: 30 min  Stress: Not on file  Social Connections: Not on file    No Known Allergies  Outpatient Medications Prior  to Visit  Medication Sig Dispense Refill   cetirizine (ZYRTEC) 10 MG tablet Take 1 tablet (10 mg total) by mouth daily. 30 tablet 11   Multiple Vitamins-Minerals (MULTIVITAMIN WITH MINERALS) tablet Take 1 tablet by mouth daily.     Omega-3 Fatty Acids (OMEGA-3 FISH OIL PO) Take by mouth daily.     ibuprofen (ADVIL,MOTRIN) 200 MG tablet 2-4 tabs by mouth every 6 hours as needed for cramping with food 30 tablet 0   losartan (COZAAR) 100 MG tablet Take 100 mg by mouth daily.     metoprolol tartrate (LOPRESSOR) 50 MG tablet 1 tab by mouth twice daily 180 tablet 3   spironolactone (ALDACTONE) 50 MG tablet Take 50 mg by mouth daily.     tiZANidine (ZANAFLEX) 4 MG  tablet Take 1 tablet (4 mg total) by mouth at bedtime. 30 tablet 0   predniSONE (DELTASONE) 20 MG tablet Take 2 tablets daily with breakfast. (Patient not taking: Reported on 05/06/2022) 10 tablet 0   No facility-administered medications prior to visit.     ROS Review of Systems  Constitutional:  Negative for activity change and appetite change.  HENT:  Negative for sinus pressure and sore throat.   Respiratory:  Negative for chest tightness, shortness of breath and wheezing.   Cardiovascular:  Negative for chest pain and palpitations.  Gastrointestinal:  Negative for abdominal distention, abdominal pain and constipation.  Genitourinary: Negative.   Musculoskeletal:        See HPI  Psychiatric/Behavioral:  Negative for behavioral problems and dysphoric mood.     Objective:  BP (!) 168/96   Pulse 74   Temp 98.4 F (36.9 C) (Oral)   Ht 5' (1.524 m)   Wt 168 lb 6.4 oz (76.4 kg)   LMP 09/01/2018   SpO2 98%   BMI 32.89 kg/m      05/06/2022    8:50 AM 01/05/2022   10:39 AM 11/28/2020    3:21 PM  BP/Weight  Systolic BP 160 109 323  Diastolic BP 96 557 322  Wt. (Lbs) 168.4  164  BMI 32.89 kg/m2  32.03 kg/m2      Physical Exam Constitutional:      Appearance: She is well-developed.  Cardiovascular:     Rate and Rhythm: Normal rate.     Heart sounds: Normal heart sounds. No murmur heard. Pulmonary:     Effort: Pulmonary effort is normal.     Breath sounds: Normal breath sounds. No wheezing or rales.  Chest:     Chest wall: No tenderness.  Abdominal:     General: Bowel sounds are normal. There is no distension.     Palpations: Abdomen is soft. There is no mass.     Tenderness: There is no abdominal tenderness.  Musculoskeletal:        General: Normal range of motion.     Right lower leg: No edema.     Left lower leg: No edema.     Comments: Negative Hawkins and Neer signs Normal handgrip bilaterally  Neurological:     Mental Status: She is alert and oriented to  person, place, and time.  Psychiatric:        Mood and Affect: Mood normal.        Latest Ref Rng & Units 09/21/2019    6:04 PM 04/01/2018    9:17 AM 11/23/2017    3:38 PM  CMP  Glucose 70 - 99 mg/dL 138  105  109   BUN 6 - 20  mg/dL _0 Creatinine 0.44 - 1.00 mg/dL 0.72  0.53  0.50   Sodium 135 - 145 mmol/L 138  140  139   Potassium 3.5 - 5.1 mmol/L 2.9  3.7  4.1   Chloride 98 - 111 mmol/L 104  104  101   CO2 22 - 32 mmol/L _1 Calcium 8.9 - 10.3 mg/dL 9.1  8.8  9.1   Total Protein 6.0 - 8.5 g/dL  6.9    Total Bilirubin 0.0 - 1.2 mg/dL  0.3    Alkaline Phos 39 - 117 IU/L  72    AST 0 - 40 IU/L  19    ALT 0 - 32 IU/L  14      Lipid Panel     Component Value Date/Time   CHOL 157 04/01/2018 0917   TRIG 133 04/01/2018 0917   HDL 46 04/01/2018 0917   CHOLHDL 4.1 Ratio 06/22/2007 0000   VLDL 38 06/22/2007 0000   LDLCALC 84 04/01/2018 0917    CBC    Component Value Date/Time   WBC 11.9 (H) 09/21/2019 1804   RBC 4.89 09/21/2019 1804   HGB 14.0 09/21/2019 1804   HGB 12.3 04/01/2018 0917   HCT 43.1 09/21/2019 1804   HCT 37.4 04/01/2018 0917   PLT 320 09/21/2019 1804   PLT 296 04/01/2018 0917   MCV 88.1 09/21/2019 1804   MCV 85 04/01/2018 0917   MCH 28.6 09/21/2019 1804   MCHC 32.5 09/21/2019 1804   RDW 13.0 09/21/2019 1804   RDW 13.8 04/01/2018 0917   LYMPHSABS 1.9 04/01/2018 0917   MONOABS 0.7 04/22/2013 1919   EOSABS 0.1 04/01/2018 0917   BASOSABS 0.0 04/01/2018 0917    Lab Results  Component Value Date   HGBA1C 5.6 04/01/2018    Assessment & Plan:  1. Essential hypertension Uncontrolled due to the fact that she is yet to take her antihypertensive today Counseled on blood pressure goal of less than 130/80, low-sodium, DASH diet, medication compliance, 150 minutes of moderate intensity exercise per week. Discussed medication compliance, adverse effects. - LP+Non-HDL Cholesterol - CMP14+EGFR - CBC with Differential/Platelet - losartan  (COZAAR) 100 MG tablet; Take 1 tablet (100 mg total) by mouth daily.  Dispense: 90 tablet; Refill: 1 - metoprolol tartrate (LOPRESSOR) 50 MG tablet; 1 tab by mouth twice daily  Dispense: 180 tablet; Refill: 1 - spironolactone (ALDACTONE) 50 MG tablet; Take 1 tablet (50 mg total) by mouth daily.  Dispense: 90 tablet; Refill: 1  2. Fibroids - Ambulatory referral to Gynecology  3. Chronic left shoulder pain Suspicious for rotator cuff pathology Trial of PT and NSAIDs and if symptoms persist consider Ortho referral - meloxicam (MOBIC) 7.5 MG tablet; Take 1 tablet (7.5 mg total) by mouth daily.  Dispense: 30 tablet; Refill: 1 - tiZANidine (ZANAFLEX) 4 MG tablet; Take 1 tablet (4 mg total) by mouth every 8 (eight) hours as needed for muscle spasms.  Dispense: 90 tablet; Refill: 1 - Ambulatory referral to Physical Therapy  4. Screening for diabetes mellitus - Hemoglobin A1c  5. Screening for viral disease - HCV Ab w Reflex to Quant PCR - HIV Antibody (routine testing w rflx)  6. Need for immunization against influenza - Flu Vaccine QUAD 43moIM (Fluarix, Fluzone & Alfiuria Quad PF)    Meds ordered this encounter  Medications   meloxicam (MOBIC) 7.5 MG tablet    Sig: Take 1 tablet (7.5 mg total) by mouth daily.  Dispense:  30 tablet    Refill:  1   losartan (COZAAR) 100 MG tablet    Sig: Take 1 tablet (100 mg total) by mouth daily.    Dispense:  90 tablet    Refill:  1   metoprolol tartrate (LOPRESSOR) 50 MG tablet    Sig: 1 tab by mouth twice daily    Dispense:  180 tablet    Refill:  1   spironolactone (ALDACTONE) 50 MG tablet    Sig: Take 1 tablet (50 mg total) by mouth daily.    Dispense:  90 tablet    Refill:  1   tiZANidine (ZANAFLEX) 4 MG tablet    Sig: Take 1 tablet (4 mg total) by mouth every 8 (eight) hours as needed for muscle spasms.    Dispense:  90 tablet    Refill:  1    Follow-up: Return in about 1 month (around 06/06/2022) for [place], MD, FAAFP. [facility] and [place], [place]   05/06/2022, 11:26 AM

## 2022-05-06 NOTE — Progress Notes (Signed)
Discuss fibroids Pain in left shoulder Medication refills.

## 2022-05-06 NOTE — Patient Instructions (Signed)

## 2022-05-07 LAB — CMP14+EGFR
ALT: 18 IU/L (ref 0–32)
AST: 23 IU/L (ref 0–40)
Albumin/Globulin Ratio: 1.5 (ref 1.2–2.2)
Albumin: 4.8 g/dL (ref 3.8–4.9)
Alkaline Phosphatase: 115 IU/L (ref 44–121)
BUN/Creatinine Ratio: 22 (ref 9–23)
BUN: 16 mg/dL (ref 6–24)
Bilirubin Total: 0.3 mg/dL (ref 0.0–1.2)
CO2: 24 mmol/L (ref 20–29)
Calcium: 10.1 mg/dL (ref 8.7–10.2)
Chloride: 101 mmol/L (ref 96–106)
Creatinine, Ser: 0.72 mg/dL (ref 0.57–1.00)
Globulin, Total: 3.1 g/dL (ref 1.5–4.5)
Glucose: 109 mg/dL — ABNORMAL HIGH (ref 70–99)
Potassium: 4.4 mmol/L (ref 3.5–5.2)
Sodium: 141 mmol/L (ref 134–144)
Total Protein: 7.9 g/dL (ref 6.0–8.5)
eGFR: 101 mL/min/{1.73_m2} (ref >59)

## 2022-05-07 LAB — HCV INTERPRETATION

## 2022-05-07 LAB — CBC WITH DIFFERENTIAL/PLATELET
Basophils Absolute: 0 10*3/uL (ref 0.0–0.2)
Basos: 1 %
EOS (ABSOLUTE): 0.1 10*3/uL (ref 0.0–0.4)
Eos: 1 %
Hematocrit: 43.2 % (ref 34.0–46.6)
Hemoglobin: 14.5 g/dL (ref 11.1–15.9)
Immature Grans (Abs): 0 10*3/uL (ref 0.0–0.1)
Immature Granulocytes: 0 %
Lymphocytes Absolute: 2.2 10*3/uL (ref 0.7–3.1)
Lymphs: 27 %
MCH: 28.7 pg (ref 26.6–33.0)
MCHC: 33.6 g/dL (ref 31.5–35.7)
MCV: 86 fL (ref 79–97)
Monocytes Absolute: 0.5 10*3/uL (ref 0.1–0.9)
Monocytes: 6 %
Neutrophils Absolute: 5.3 10*3/uL (ref 1.4–7.0)
Neutrophils: 65 %
Platelets: 289 10*3/uL (ref 150–450)
RBC: 5.05 x10E6/uL (ref 3.77–5.28)
RDW: 13.4 % (ref 11.7–15.4)
WBC: 8.2 10*3/uL (ref 3.4–10.8)

## 2022-05-07 LAB — LP+NON-HDL CHOLESTEROL
Cholesterol, Total: 223 mg/dL — ABNORMAL HIGH (ref 100–199)
HDL: 50 mg/dL (ref >39)
LDL Chol Calc (NIH): 140 mg/dL — ABNORMAL HIGH (ref 0–99)
Total Non-HDL-Chol (LDL+VLDL): 173 mg/dL — ABNORMAL HIGH (ref 0–129)
Triglycerides: 185 mg/dL — ABNORMAL HIGH (ref 0–149)
VLDL Cholesterol Cal: 33 mg/dL (ref 5–40)

## 2022-05-07 LAB — HEMOGLOBIN A1C
Est. average glucose Bld gHb Est-mCnc: 134 mg/dL
Hgb A1c MFr Bld: 6.3 % — ABNORMAL HIGH (ref 4.8–5.6)

## 2022-05-07 LAB — HCV AB W REFLEX TO QUANT PCR: HCV Ab: NONREACTIVE

## 2022-05-07 LAB — HIV ANTIBODY (ROUTINE TESTING W REFLEX): HIV Screen 4th Generation wRfx: NONREACTIVE

## 2022-05-19 ENCOUNTER — Encounter: Payer: Self-pay | Admitting: Physical Therapy

## 2022-05-19 ENCOUNTER — Ambulatory Visit: Payer: 59 | Attending: Family Medicine | Admitting: Physical Therapy

## 2022-05-19 DIAGNOSIS — M25512 Pain in left shoulder: Secondary | ICD-10-CM | POA: Diagnosis not present

## 2022-05-19 DIAGNOSIS — R278 Other lack of coordination: Secondary | ICD-10-CM | POA: Insufficient documentation

## 2022-05-19 DIAGNOSIS — R293 Abnormal posture: Secondary | ICD-10-CM | POA: Insufficient documentation

## 2022-05-19 DIAGNOSIS — M6281 Muscle weakness (generalized): Secondary | ICD-10-CM | POA: Diagnosis not present

## 2022-05-19 DIAGNOSIS — G8929 Other chronic pain: Secondary | ICD-10-CM | POA: Diagnosis not present

## 2022-05-19 DIAGNOSIS — R2681 Unsteadiness on feet: Secondary | ICD-10-CM | POA: Diagnosis not present

## 2022-05-19 NOTE — Therapy (Signed)
OUTPATIENT PHYSICAL THERAPY SHOULDER EVALUATION   Patient Name: Alexandra Wu MRN: [id number] DOB:[birth year], 51 y.o., female Today's Date: 05/19/2022   PT End of Session - 05/19/22 1714     Visit Number 1    Date for PT Re-Evaluation 07/28/22    PT Start Time 1630    PT Stop Time 7062    PT Time Calculation (min) 42 min    Activity Tolerance Patient tolerated treatment well    Behavior During Therapy [facility] for tasks assessed/performed             Past Medical History:  Diagnosis Date   Appendicitis, acute 04/23/2013   Lap appendectomy on 03/23/13   Dyslipidemia 03/24/2017   Hyperglycemia 02/17/2017   Hypertension    Nodular goiter 03/24/2017   Thyromegaly 02/17/2017   Past Surgical History:  Procedure Laterality Date   LAPAROSCOPIC APPENDECTOMY N/A 04/23/2013   Procedure: APPENDECTOMY LAPAROSCOPIC;  Surgeon: Merrie Roof, MD;  Location: [place];  Service: General;  Laterality: N/A;   [place]   Patient Active Problem List   Diagnosis Date Noted   Fibroids 05/06/2022   Nodular goiter 03/24/2017   Dyslipidemia 03/24/2017   Hyperglycemia 02/17/2017   Thyromegaly 02/17/2017   Appendicitis, acute 04/23/2013   CANDIDIASIS, VAGINAL 11/15/2007   MIGRAINE HEADACHE 11/30/2006   Essential hypertension 11/30/2006   ALLERGIC RHINITIS 11/30/2006    PCP: Charlott Rakes, MD   REFERRING PROVIDER: Charlott Rakes, MD   REFERRING DIAG: Diagnosis M25.512,G89.29 (ICD-10-CM) - Chronic left shoulder pain   THERAPY DIAG:  Abnormal posture  Muscle weakness (generalized)  Unsteadiness on feet  Other lack of coordination  Rationale for Evaluation and Treatment Rehabilitation  ONSET DATE: 05/06/2022   SUBJECTIVE:                                                                                                                                                                                      SUBJECTIVE STATEMENT: Using interpreter. L shoulder pain  for about. No specific incident caused it. It has felt better in the past few days. She has a new pillow and has started a walking routine that seem to help  PERTINENT HISTORY: Interval History: She complains of left shoulder pain and fibroids. Pain in left shoulder has been present x  12 months worse at night with difficulty lifting a gallon of milk, painful overhead motion. Associated numbness and tingling.Tylenol does not provide relief as pain can sometimes be moderate. She is right hand dominant.Musculoskeletal:        General: Normal range of motion.     Right lower leg: No edema.     Left  lower leg: No edema.     Comments: Negative Hawkins and Neer signs Normal handgrip bilaterally  PAIN:  Are you having pain? Yes: NPRS scale: 9/10 Pain location: L shoulder Pain description: sharp, can travel down into the hand. Aggravating factors: Sleeping position Relieving factors: Uses pillows under boht arms to prevent pain.  PRECAUTIONS: None  WEIGHT BEARING RESTRICTIONS: No  FALLS:  Has patient fallen in last 6 months? No  LIVING ENVIRONMENT: Lives with: lives with their family and lives with their spouse Lives in: House/apartment Stairs: Yes: External: 4 steps; bilateral but cannot reach both Has following equipment at home: None  OCCUPATION: Not working at this time.  PLOF: Independent  PATIENT GOALS:She would like to get some exercises to help prevent the pain from coming back. 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  OBJECTIVE:   PATIENT SURVEYS:  FOTO 60.49  COGNITION: Overall cognitive status: Within functional limits for tasks assessed     SENSATION: Not tested  POSTURE: Round shoulders, increased lumbar lordosis  UPPER EXTREMITY ROM:   Active ROM Right eval Left eval  Shoulder flexion 110 110  Shoulder extension    Shoulder abduction 90 90  Shoulder adduction    Shoulder internal rotation    Shoulder external rotation    Elbow  flexion    Elbow extension    Wrist flexion    Wrist extension    Wrist ulnar deviation    Wrist radial deviation    Wrist pronation    Wrist supination    (Blank rows = not tested)  UPPER EXTREMITY MMT: Grossly 3+/5 throughout shoulders, elbows   SHOULDER SPECIAL TESTS: Impingement tests: Hawkins/Kennedy impingement test: negative Rotator cuff assessment: Empty can test: positive , Belly press test: positive , and Infraspinatus test: positive    JOINT MOBILITY TESTING:  B shoulder inferior glides limited.  PALPATION:  Mildly TTP over L supraspinatus tendon   TODAY'S TREATMENT:                                                                                                                           DATE: 05/19/22 Patient Education HEP   PATIENT EDUCATION: Education details: HEP, POC Person educated: Patient Education method: Consulting civil engineer, Media planner, and Handouts Education comprehension: verbalized understanding and returned demonstration  HOME EXERCISE PROGRAM:  [facility]  ASSESSMENT:  CLINICAL IMPRESSION: Patient is a 51 y.o. who was seen today for physical therapy evaluation and treatment for L shoulder pain. She also reports B knee pain and unsteadiness due to arthritis. The shoulder feels much improved since she has been positioning her arms on pillows at night. She demonstrates weakness throughout trunk and B U and LE, rounded shoulders with severely tight pects B. She is requesting to limit her visits to 1 additional visit to update her HEP due to financial considerations.  OBJECTIVE IMPAIRMENTS: Abnormal gait, decreased ROM, decreased strength, impaired UE functional use, and pain.   ACTIVITY LIMITATIONS: carrying, lifting, sleeping, and  reach over head  PARTICIPATION LIMITATIONS: meal prep, cleaning, and laundry  PERSONAL FACTORS: Past/current experiences are also affecting patient's functional outcome.   REHAB POTENTIAL: Good  CLINICAL DECISION MAKING:  Stable/uncomplicated  EVALUATION COMPLEXITY: Low   GOALS: Goals reviewed with patient? Yes  SHORT TERM GOALS: Target date: 06/16/2022  (Remove Blue Hyperlink)  I with basic HEP Baseline: Goal status: INITIAL  LONG TERM GOALS: Target date: 07/28/2022  (Remove Blue Hyperlink)  I with final HEP to address shoulder stability, postural control, BLE strength Baseline:  Goal status: INITIAL  2.  Patient will report no increase in shoulder pain. Baseline:  Goal status: INITIAL PLAN:  PT FREQUENCY: 1x/week  PT DURATION: 10 weeks  PLANNED INTERVENTIONS: Therapeutic exercises, Therapeutic activity, Neuromuscular re-education, Balance training, Gait training, Patient/Family education, Self Care, Dry Needling, Cryotherapy, Moist heat, Vasopneumatic device, Ionotophoresis '4mg'$ /ml Dexamethasone, and Manual therapy  PLAN FOR NEXT SESSION: Update HEP, possible D/C due to financial concerns.   Marcelina Morel, DPT 05/19/2022, 6:49 PM

## 2022-06-02 ENCOUNTER — Encounter: Payer: Self-pay | Admitting: Physical Therapy

## 2022-06-02 ENCOUNTER — Ambulatory Visit: Payer: 59 | Attending: Family Medicine | Admitting: Physical Therapy

## 2022-06-02 DIAGNOSIS — R278 Other lack of coordination: Secondary | ICD-10-CM

## 2022-06-02 DIAGNOSIS — R293 Abnormal posture: Secondary | ICD-10-CM

## 2022-06-02 DIAGNOSIS — M6281 Muscle weakness (generalized): Secondary | ICD-10-CM

## 2022-06-02 DIAGNOSIS — R2681 Unsteadiness on feet: Secondary | ICD-10-CM

## 2022-06-02 NOTE — Therapy (Signed)
OUTPATIENT PHYSICAL THERAPY SHOULDER EVALUATION   Patient Name: Alexandra Wu MRN: [id number] DOB:Jun 14, 1971, 51 y.o., female Today's Date: 06/02/2022   PT End of Session - 06/02/22 1744     PT Start Time 4742    PT Stop Time 5956    PT Time Calculation (min) 27 min           PHYSICAL THERAPY DISCHARGE SUMMARY  Visits from Start of Care: 2  Current functional level related to goals / functional outcomes: No pain, goals met   Remaining deficits: N/A   Education / Equipment: HEP   Patient agrees to discharge. Patient goals were met. Patient is being discharged due to financial reasons.    Past Medical History:  Diagnosis Date   Appendicitis, acute 04/23/2013   Lap appendectomy on 03/23/13   Dyslipidemia 03/24/2017   Hyperglycemia 02/17/2017   Hypertension    Nodular goiter 03/24/2017   Thyromegaly 02/17/2017   Past Surgical History:  Procedure Laterality Date   LAPAROSCOPIC APPENDECTOMY N/A 04/23/2013   Procedure: APPENDECTOMY LAPAROSCOPIC;  Surgeon: Merrie Roof, MD;  Location: [facility] OR;  Service: General;  Laterality: N/A;   [place]   Patient Active Problem List   Diagnosis Date Noted   Fibroids 05/06/2022   Nodular goiter 03/24/2017   Dyslipidemia 03/24/2017   Hyperglycemia 02/17/2017   Thyromegaly 02/17/2017   Appendicitis, acute 04/23/2013   CANDIDIASIS, VAGINAL 11/15/2007   MIGRAINE HEADACHE 11/30/2006   Essential hypertension 11/30/2006   ALLERGIC RHINITIS 11/30/2006    PCP: Charlott Rakes, MD   REFERRING PROVIDER: Charlott Rakes, MD   REFERRING DIAG: Diagnosis M25.512,G89.29 (ICD-10-CM) - Chronic left shoulder pain   THERAPY DIAG:  Abnormal posture  Muscle weakness (generalized)  Unsteadiness on feet  Other lack of coordination  Rationale for Evaluation and Treatment Rehabilitation  ONSET DATE: 05/06/2022   SUBJECTIVE:                                                                                                                                                                                       SUBJECTIVE STATEMENT: Using interpreter. L shoulder pain for about. No specific incident caused it. It has felt better in the past few days. She has a new pillow and has started a walking routine that seem to help  PERTINENT HISTORY: Interval History: She complains of left shoulder pain and fibroids. Pain in left shoulder has been present x  12 months worse at night with difficulty lifting a gallon of milk, painful overhead motion. Associated numbness and tingling.Tylenol does not provide relief as pain can sometimes be moderate. She is right hand dominant.Musculoskeletal:  General: Normal range of motion.     Right lower leg: No edema.     Left lower leg: No edema.     Comments: Negative Hawkins and Neer signs Normal handgrip bilaterally  PAIN:  Are you having pain? Yes: NPRS scale: 9/10 Pain location: L shoulder Pain description: sharp, can travel down into the hand. Aggravating factors: Sleeping position Relieving factors: Uses pillows under boht arms to prevent pain.  PRECAUTIONS: None  WEIGHT BEARING RESTRICTIONS: No  FALLS:  Has patient fallen in last 6 months? No  LIVING ENVIRONMENT: Lives with: lives with their family and lives with their spouse Lives in: House/apartment Stairs: Yes: External: 4 steps; bilateral but cannot reach both Has following equipment at home: None  OCCUPATION: Not working at this time.  PLOF: Independent  PATIENT GOALS:She would like to get some exercises to help prevent the pain from coming back. 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  OBJECTIVE:   PATIENT SURVEYS:  FOTO 60.49  COGNITION: Overall cognitive status: Within functional limits for tasks assessed     SENSATION: Not tested  POSTURE: Round shoulders, increased lumbar lordosis  UPPER EXTREMITY ROM:   Active ROM Right eval Left eval  Shoulder flexion 110  110  Shoulder extension    Shoulder abduction 90 90  Shoulder adduction    Shoulder internal rotation    Shoulder external rotation    Elbow flexion    Elbow extension    Wrist flexion    Wrist extension    Wrist ulnar deviation    Wrist radial deviation    Wrist pronation    Wrist supination    (Blank rows = not tested)  UPPER EXTREMITY MMT: Grossly 3+/5 throughout shoulders, elbows   SHOULDER SPECIAL TESTS: Impingement tests: Hawkins/Kennedy impingement test: negative Rotator cuff assessment: Empty can test: positive , Belly press test: positive , and Infraspinatus test: positive    JOINT MOBILITY TESTING:  B shoulder inferior glides limited.  PALPATION:  Mildly TTP over L supraspinatus tendon   TODAY'S TREATMENT:                                                                                                                           DATE: 06/02/22 UBE L1, 2 min forward/2 back Shoulder ext, rows, ER, 2 x 10 with G tband Wall slides x 5 flex, abd, L Serratus punch against G tband 2 x 10 reps Seated shoulder inf glide stretch 3 x 15 sec Pect stretch 3 x 15 sec  05/19/22 Patient Education HEP   PATIENT EDUCATION: Education details: HEP, POC Person educated: Patient Education method: Consulting civil engineer, Media planner, and Handouts Education comprehension: verbalized understanding and returned demonstration  HOME EXERCISE PROGRAM:  [facility]  ASSESSMENT:  CLINICAL IMPRESSION: Patient reports continued relief of her shoulder pain. Established final HEP which included strength and ROM exercises. Patient reports she can not afford to continue to come to therapy.  OBJECTIVE IMPAIRMENTS: Abnormal gait, decreased ROM, decreased  strength, impaired UE functional use, and pain.   ACTIVITY LIMITATIONS: carrying, lifting, sleeping, and reach over head  PARTICIPATION LIMITATIONS: meal prep, cleaning, and laundry  PERSONAL FACTORS: Past/current experiences are also affecting  patient's functional outcome.   REHAB POTENTIAL: Good  CLINICAL DECISION MAKING: Stable/uncomplicated  EVALUATION COMPLEXITY: Low   GOALS: Goals reviewed with patient? Yes  SHORT TERM GOALS: Target date: 06/16/2022  (Remove Blue Hyperlink)  I with basic HEP Baseline: Goal status: met  LONG TERM GOALS: Target date: 07/28/2022  (Remove Blue Hyperlink)  I with final HEP to address shoulder stability, postural control, BLE strength Baseline:  Goal status: met  2.  Patient will report no increase in shoulder pain. Baseline:  Goal status: met PLAN:  PT FREQUENCY: 1x/week  PT DURATION: 10 weeks  PLANNED INTERVENTIONS: Therapeutic exercises, Therapeutic activity, Neuromuscular re-education, Balance training, Gait training, Patient/Family education, Self Care, Dry Needling, Cryotherapy, Moist heat, Vasopneumatic device, Ionotophoresis 43m/ml Dexamethasone, and Manual therapy  PLAN FOR NEXT SESSION: Update HEP, possible D/C due to financial concerns.   SMarcelina Morel DPT 06/02/2022, 5:49 PM

## 2022-06-17 ENCOUNTER — Other Ambulatory Visit (HOSPITAL_COMMUNITY)
Admission: RE | Admit: 2022-06-17 | Discharge: 2022-06-17 | Disposition: A | Discharge status: Home / Self Care | Payer: 59 | Source: Ambulatory Visit | Attending: Family Medicine | Admitting: Family Medicine

## 2022-06-17 ENCOUNTER — Ambulatory Visit: Payer: 59 | Attending: Family Medicine | Admitting: Family Medicine

## 2022-06-17 ENCOUNTER — Encounter: Payer: Self-pay | Admitting: Family Medicine

## 2022-06-17 VITALS — BP 168/91 | HR 62 | Temp 98.3°F | Ht 60.0 in | Wt 168.4 lb

## 2022-06-17 DIAGNOSIS — I10 Essential (primary) hypertension: Secondary | ICD-10-CM

## 2022-06-17 DIAGNOSIS — Z0001 Encounter for general adult medical examination with abnormal findings: Secondary | ICD-10-CM

## 2022-06-17 DIAGNOSIS — Z1231 Encounter for screening mammogram for malignant neoplasm of breast: Secondary | ICD-10-CM

## 2022-06-17 DIAGNOSIS — Z1211 Encounter for screening for malignant neoplasm of colon: Secondary | ICD-10-CM

## 2022-06-17 DIAGNOSIS — Z124 Encounter for screening for malignant neoplasm of cervix: Secondary | ICD-10-CM | POA: Insufficient documentation

## 2022-06-17 NOTE — Progress Notes (Signed)
Subjective:  Patient ID: Alexandra Wu, female    DOB: 1971-05-22  Age: 51 y.o. MRN: [id number]  CC: Annual Exam and Gynecologic Exam   HPI Alexandra Wu is a 51 y.o. year old female with a history of Hypertension. She presents for an annual physical exam.  Interval History:  BP is elevated and she is yet to take her antihypertensive as she is fasting. Home systolic BP is usually around 138.  She is due for a screening for Colon ca, screening for breast ca and screening for cervical. She exercises by means of walking. Past Medical History:  Diagnosis Date   Appendicitis, acute 04/23/2013   Lap appendectomy on 03/23/13   Dyslipidemia 03/24/2017   Hyperglycemia 02/17/2017   Hypertension    Nodular goiter 03/24/2017   Thyromegaly 02/17/2017    Past Surgical History:  Procedure Laterality Date   LAPAROSCOPIC APPENDECTOMY N/A 04/23/2013   Procedure: APPENDECTOMY LAPAROSCOPIC;  Surgeon: Merrie Roof, MD;  Location: [facility] OR;  Service: General;  Laterality: N/A;   TUBAL LIGATION  1997    Family History  Problem Relation Age of Onset   Arthritis Mother    Hypertension Mother    Arthritis Sister    Diabetes Sister    Heart disease Brother    Arthritis Sister    Headache Daughter     Social History   Socioeconomic History   Marital status: Married    Spouse name: Scientist, research (medical)   Number of children: 3   Years of education: 1   Highest education level: Not on file  Occupational History   Occupation: Arboriculturist for offices  Tobacco Use   Smoking status: Never   Smokeless tobacco: Never  Vaping Use   Vaping Use: Never used  Substance and Sexual Activity   Alcohol use: No   Drug use: No   Sexual activity: Yes    Birth control/protection: Surgical  Other Topics Concern   Not on file  Social History Narrative   Originally from [place]   Came to [place]. In 2006   Lives at home with husband   Social Determinants of Health    Financial Resource Strain: Low Risk  (03/03/2018)   Overall Financial Resource Strain (CARDIA)    Difficulty of Paying Living Expenses: Not very hard  Food Insecurity: No Food Insecurity (03/03/2018)   Hunger Vital Sign    Worried About Running Out of Food in the Last Year: Never true    Ran Out of Food in the Last Year: Never true  Transportation Needs: No Transportation Needs (03/03/2018)   PRAPARE - Hydrologist (Medical): No    Lack of Transportation (Non-Medical): No  Physical Activity: Sufficiently Active (03/03/2018)   Exercise Vital Sign    Days of Exercise per Week: 7 days    Minutes of Exercise per Session: 30 min  Stress: Not on file  Social Connections: Not on file    No Known Allergies  Outpatient Medications Prior to Visit  Medication Sig Dispense Refill   losartan (COZAAR) 100 MG tablet Take 1 tablet (100 mg total) by mouth daily. 90 tablet 1   spironolactone (ALDACTONE) 50 MG tablet Take 1 tablet (50 mg total) by mouth daily. 90 tablet 1   cetirizine (ZYRTEC) 10 MG tablet Take 1 tablet (10 mg total) by mouth daily. (Patient not taking: Reported on 06/17/2022) 30 tablet 11   meloxicam (MOBIC) 7.5 MG tablet Take 1 tablet (7.5  mg total) by mouth daily. (Patient not taking: Reported on 06/17/2022) 30 tablet 1   metoprolol tartrate (LOPRESSOR) 50 MG tablet 1 tab by mouth twice daily (Patient not taking: Reported on 06/17/2022) 180 tablet 1   Multiple Vitamins-Minerals (MULTIVITAMIN WITH MINERALS) tablet Take 1 tablet by mouth daily. (Patient not taking: Reported on 06/17/2022)     Omega-3 Fatty Acids (OMEGA-3 FISH OIL PO) Take by mouth daily. (Patient not taking: Reported on 06/17/2022)     tiZANidine (ZANAFLEX) 4 MG tablet Take 1 tablet (4 mg total) by mouth every 8 (eight) hours as needed for muscle spasms. (Patient not taking: Reported on 06/17/2022) 90 tablet 1   No facility-administered medications prior to visit.     ROS Review of Systems   Constitutional:  Negative for activity change, appetite change and fatigue.  HENT:  Negative for congestion, sinus pressure and sore throat.   Eyes:  Negative for visual disturbance.  Respiratory:  Negative for cough, chest tightness, shortness of breath and wheezing.   Cardiovascular:  Negative for chest pain and palpitations.  Gastrointestinal:  Negative for abdominal distention, abdominal pain and constipation.  Endocrine: Negative for polydipsia.  Genitourinary:  Negative for dysuria and frequency.  Musculoskeletal:  Negative for arthralgias and back pain.  Skin:  Negative for rash.  Neurological:  Negative for tremors, light-headedness and numbness.  Hematological:  Does not bruise/bleed easily.  Psychiatric/Behavioral:  Negative for agitation and behavioral problems.     Objective:  BP (!) 168/91   Pulse 62   Temp 98.3 F (36.8 C) (Oral)   Ht 5' (1.524 m)   Wt 168 lb 6.4 oz (76.4 kg)   LMP 09/01/2018   SpO2 100%   BMI 32.89 kg/m      06/17/2022   10:53 AM 05/06/2022    8:50 AM 01/05/2022   10:39 AM  BP/Weight  Systolic BP 322 025 427  Diastolic BP 91 96 062  Wt. (Lbs) 168.4 168.4   BMI 32.89 kg/m2 32.89 kg/m2       Physical Exam Exam conducted with a chaperone present.  Constitutional:      General: She is not in acute distress.    Appearance: She is well-developed. She is not diaphoretic.  HENT:     Head: Normocephalic.     Right Ear: External ear normal.     Left Ear: External ear normal.     Nose: Nose normal.  Eyes:     Conjunctiva/sclera: Conjunctivae normal.     Pupils: Pupils are equal, round, and reactive to light.  Neck:     Vascular: No JVD.  Cardiovascular:     Rate and Rhythm: Normal rate and regular rhythm.     Heart sounds: Normal heart sounds. No murmur heard.    No gallop.  Pulmonary:     Effort: Pulmonary effort is normal. No respiratory distress.     Breath sounds: Normal breath sounds. No wheezing or rales.  Chest:     Chest  wall: No tenderness.  Breasts:    Right: Normal. No mass, nipple discharge or tenderness.     Left: Normal. No mass, nipple discharge or tenderness.  Abdominal:     General: Bowel sounds are normal. There is no distension.     Palpations: Abdomen is soft. There is no mass.     Tenderness: There is no abdominal tenderness.     Hernia: There is no hernia in the left inguinal area or right inguinal area.  Genitourinary:  General: Normal vulva.     Pubic Area: No rash.      Labia:        Right: No rash.        Left: No rash.      Vagina: Normal.     Cervix: Normal.     Uterus: Normal.      Adnexa: Right adnexa normal and left adnexa normal.       Right: No tenderness.         Left: No tenderness.    Musculoskeletal:        General: No tenderness. Normal range of motion.     Cervical back: Normal range of motion. No tenderness.  Lymphadenopathy:     Upper Body:     Right upper body: No supraclavicular or axillary adenopathy.     Left upper body: No supraclavicular or axillary adenopathy.  Skin:    General: Skin is warm and dry.  Neurological:     Mental Status: She is alert and oriented to person, place, and time.     Deep Tendon Reflexes: Reflexes are normal and symmetric.        Latest Ref Rng & Units 05/06/2022    9:33 AM 09/21/2019    6:04 PM 04/01/2018    9:17 AM  CMP  Glucose 70 - 99 mg/dL 109  138  105   BUN 6 - 24 mg/dL '16  8  11   '$ Creatinine 0.57 - 1.00 mg/dL 0.72  0.72  0.53   Sodium 134 - 144 mmol/L 141  138  140   Potassium 3.5 - 5.2 mmol/L 4.4  2.9  3.7   Chloride 96 - 106 mmol/L 101  104  104   CO2 20 - 29 mmol/L '24  24  22   '$ Calcium 8.7 - 10.2 mg/dL 10.1  9.1  8.8   Total Protein 6.0 - 8.5 g/dL 7.9   6.9   Total Bilirubin 0.0 - 1.2 mg/dL 0.3   0.3   Alkaline Phos 44 - 121 IU/L 115   72   AST 0 - 40 IU/L 23   19   ALT 0 - 32 IU/L 18   14     Lipid Panel     Component Value Date/Time   CHOL 223 (H) 05/06/2022 0933   TRIG 185 (H) 05/06/2022 0933    HDL 50 05/06/2022 0933   CHOLHDL 4.1 Ratio 06/22/2007 0000   VLDL 38 06/22/2007 0000   LDLCALC 140 (H) 05/06/2022 0933    CBC    Component Value Date/Time   WBC 8.2 05/06/2022 0933   WBC 11.9 (H) 09/21/2019 1804   RBC 5.05 05/06/2022 0933   RBC 4.89 09/21/2019 1804   HGB 14.5 05/06/2022 0933   HCT 43.2 05/06/2022 0933   PLT 289 05/06/2022 0933   MCV 86 05/06/2022 0933   MCH 28.7 05/06/2022 0933   MCH 28.6 09/21/2019 1804   MCHC 33.6 05/06/2022 0933   MCHC 32.5 09/21/2019 1804   RDW 13.4 05/06/2022 0933   LYMPHSABS 2.2 05/06/2022 0933   MONOABS 0.7 04/22/2013 1919   EOSABS 0.1 05/06/2022 0933   BASOSABS 0.0 05/06/2022 0933    Lab Results  Component Value Date   HGBA1C 6.3 (H) 05/06/2022    The 10-year ASCVD risk score (Arnett DK, et al., 2019) is: 4.4%   Values used to calculate the score:     Age: 10 years     Sex: Female     Is  Non-Hispanic African American: No     Diabetic: No     Tobacco smoker: No     Systolic Blood Pressure: 308 mmHg     Is BP treated: Yes     HDL Cholesterol: 50 mg/dL     Total Cholesterol: 223 mg/dL  Assessment & Plan:  1. Annual visit for general adult medical examination with abnormal findings Blood pressure is elevated today due to not taking her antihypertensive Controlled on medication adherence and the need to take her antihypertensive prior to her visit next time Counseled on blood pressure goal of less than 130/80, low-sodium, DASH diet, medication compliance, 150 minutes of moderate intensity exercise per week. Discussed medication compliance, adverse effects.  2. Encounter for screening mammogram for malignant neoplasm of breast - MM DIGITAL SCREENING BILATERAL; Future  3. Screening for cervical cancer - Cytology - PAP  4. Screening for colon cancer - Ambulatory referral to Gastroenterology  5.  Essential hypertension Uncontrolled due to not taking her antihypertensive today Medication adherence has been emphasized No  regimen change today and will reassess at next visit   No orders of the defined types were placed in this encounter.   Follow-up: Return in about 3 months (around 09/17/2022) for Chronic medical conditions, 2nd dose shingrix.       Charlott Rakes, MD, FAAFP. [facility] and [facility], [place]   06/17/2022, 12:10 PM

## 2022-06-17 NOTE — Patient Instructions (Signed)

## 2022-06-20 LAB — CYTOLOGY - PAP
Comment: NEGATIVE
Diagnosis: NEGATIVE
High risk HPV: NEGATIVE

## 2022-07-07 ENCOUNTER — Encounter: Payer: Self-pay | Admitting: Family Medicine

## 2022-07-07 ENCOUNTER — Encounter: Payer: Self-pay | Admitting: Obstetrics and Gynecology

## 2022-07-07 ENCOUNTER — Ambulatory Visit (INDEPENDENT_AMBULATORY_CARE_PROVIDER_SITE_OTHER): Payer: 59 | Admitting: Obstetrics and Gynecology

## 2022-07-07 VITALS — BP 187/101 | HR 73 | Ht 60.0 in | Wt 167.5 lb

## 2022-07-07 DIAGNOSIS — Z86018 Personal history of other benign neoplasm: Secondary | ICD-10-CM

## 2022-07-07 DIAGNOSIS — R32 Unspecified urinary incontinence: Secondary | ICD-10-CM | POA: Diagnosis not present

## 2022-07-07 NOTE — Progress Notes (Signed)
51 yo P3 postmenopausal here for the evaluation of possible recurrence of fibroids. Patient had a hysteroscopic myomectomy in 2012. She denies any episodes of postmenopausal vaginal bleeding. She reports some occasional pelvic pressure. These symptoms have been on going for the past year. She is not sexually active due to dyspareunia. She reports some urinary incontinence with valsalva which she cannot stop. Patient states that when sneezing she cannot hold her urine. She is without any other complaints  Past Medical History:  Diagnosis Date   Appendicitis, acute 04/23/2013   Lap appendectomy on 03/23/13   Dyslipidemia 03/24/2017   Hyperglycemia 02/17/2017   Hypertension    Nodular goiter 03/24/2017   Thyromegaly 02/17/2017   Past Surgical History:  Procedure Laterality Date   LAPAROSCOPIC APPENDECTOMY N/A 04/23/2013   Procedure: APPENDECTOMY LAPAROSCOPIC;  Surgeon: Merrie Roof, MD;  Location: [facility] OR;  Service: General;  Laterality: N/A;   TUBAL LIGATION  1997   Family History  Problem Relation Age of Onset   Arthritis Mother    Hypertension Mother    Arthritis Sister    Diabetes Sister    Heart disease Brother    Arthritis Sister    Headache Daughter    Social History   Tobacco Use   Smoking status: Never   Smokeless tobacco: Never  Vaping Use   Vaping Use: Never used  Substance Use Topics   Alcohol use: No   Drug use: No   ROS See pertinent in HPI. All other systems reviewed and non contributory Blood pressure (!) 187/101, pulse 73, height 5' (1.524 m), weight 167 lb 8 oz (76 kg), last menstrual period 09/01/2018. GENERAL: Well-developed, well-nourished female in no acute distress.  ABDOMEN: Soft, nontender, nondistended. No organomegaly. PELVIC: Normal external female genitalia. Vagina is pink and rugated.  Normal discharge. Normal appearing cervix. Bimanual exam limited secondary to body habitus. No adnexal mass or tenderness. Chaperone present during the pelvic  exam EXTREMITIES: No cyanosis, clubbing, or edema, 2+ distal pulses.  A/P 51 yo with urinary incontinence and pelvic pain with a history of fibroid uterus - Pelvic ultrasound ordered to assess presence of fibroids - Patient will be contacted with results - Patient referred to urogynecology to evaluate urinary incontinence - Patient with normal pap smear 05/2022 - Patient referred for mammogram and colonoscopy by PCP 05/2022 - Patient advised to follow up with urgent care or PCP ASAP for the evaluation of HTN. Patient discontinued BP medications secondary to side effect

## 2022-07-07 NOTE — Progress Notes (Signed)
Pt presents to establish care. Pt reports Hx og fibroids.   Blood pressure being monitored by PCP. Pt states she stopped BP meds 6 days ag due to side effect.

## 2022-07-08 ENCOUNTER — Other Ambulatory Visit: Payer: Self-pay | Admitting: Family Medicine

## 2022-07-08 MED ORDER — AMLODIPINE BESYLATE 10 MG PO TABS: 10.0000 mg | ORAL_TABLET | Freq: Every day | ORAL | 3 refills | Status: DC

## 2022-07-08 MED ORDER — CARVEDILOL 3.125 MG PO TABS: 3.1250 mg | ORAL_TABLET | Freq: Two times a day (BID) | ORAL | 3 refills | Status: DC

## 2022-07-10 ENCOUNTER — Ambulatory Visit: Payer: Self-pay

## 2022-07-10 ENCOUNTER — Ambulatory Visit (HOSPITAL_BASED_OUTPATIENT_CLINIC_OR_DEPARTMENT_OTHER)
Admission: RE | Admit: 2022-07-10 | Discharge: 2022-07-10 | Disposition: A | Discharge status: Home / Self Care | Payer: 59 | Source: Ambulatory Visit | Attending: Obstetrics and Gynecology | Admitting: Obstetrics and Gynecology

## 2022-07-10 ENCOUNTER — Other Ambulatory Visit: Payer: Self-pay | Admitting: Family Medicine

## 2022-07-10 DIAGNOSIS — Z86018 Personal history of other benign neoplasm: Secondary | ICD-10-CM | POA: Insufficient documentation

## 2022-07-10 MED ORDER — CARVEDILOL 6.25 MG PO TABS: 6.2500 mg | ORAL_TABLET | Freq: Two times a day (BID) | ORAL | 3 refills | Status: DC

## 2022-07-10 NOTE — Telephone Encounter (Signed)
[facility] was called and informed to fill 6.25 mg of medication.

## 2022-07-10 NOTE — Telephone Encounter (Signed)
Summary: med ?   Rye from [facility] called in needs clarification on med, received to mg prescriptions, one says, carvedilol, 3.'125mg'$  and the other 6.25 mg, needs to know which one to give.      Call from pharmacy please verify dosage.

## 2022-07-16 ENCOUNTER — Telehealth: Payer: Self-pay | Admitting: Emergency Medicine

## 2022-07-16 NOTE — Telephone Encounter (Signed)
TC to patient with Spanish interpreter. Informed of results.

## 2022-07-16 NOTE — Telephone Encounter (Signed)
-----   Message from Mora Bellman, MD sent at 07/15/2022 10:24 AM EST ----- Please inform patient of normal ultrasound demonstrating a normal size uterus with a very small fibroid for which no intervention is needed.  I encourage her to keep appointment with urogynecologist for the evaluation of her incontinence

## 2022-08-20 ENCOUNTER — Encounter: Payer: Self-pay | Admitting: Gastroenterology

## 2022-09-17 ENCOUNTER — Encounter: Payer: Self-pay | Admitting: Family Medicine

## 2022-09-17 ENCOUNTER — Ambulatory Visit: Payer: 59 | Attending: Family Medicine | Admitting: Family Medicine

## 2022-09-17 VITALS — BP 198/108 | HR 67 | Temp 98.0°F | Ht 60.0 in | Wt 169.4 lb

## 2022-09-17 DIAGNOSIS — J0110 Acute frontal sinusitis, unspecified: Secondary | ICD-10-CM | POA: Diagnosis not present

## 2022-09-17 DIAGNOSIS — I10 Essential (primary) hypertension: Secondary | ICD-10-CM

## 2022-09-17 DIAGNOSIS — Z1211 Encounter for screening for malignant neoplasm of colon: Secondary | ICD-10-CM | POA: Diagnosis not present

## 2022-09-17 MED ORDER — AMOXICILLIN-POT CLAVULANATE 875-125 MG PO TABS: 1.0000 | ORAL_TABLET | Freq: Two times a day (BID) | ORAL | 0 refills | Status: DC

## 2022-09-17 MED ORDER — VALSARTAN 80 MG PO TABS: 80.0000 mg | ORAL_TABLET | Freq: Every day | ORAL | 1 refills | Status: DC

## 2022-09-17 MED ORDER — CARVEDILOL 6.25 MG PO TABS: 6.2500 mg | ORAL_TABLET | Freq: Two times a day (BID) | ORAL | 1 refills | Status: DC

## 2022-09-17 MED ORDER — SPIRONOLACTONE 50 MG PO TABS: 50.0000 mg | ORAL_TABLET | Freq: Every day | ORAL | 1 refills | Status: DC

## 2022-09-17 MED ORDER — AMLODIPINE BESYLATE 10 MG PO TABS: 10.0000 mg | ORAL_TABLET | Freq: Every day | ORAL | 1 refills | Status: DC

## 2022-09-17 NOTE — Progress Notes (Signed)
Having pain and drainage in right ear.

## 2022-09-17 NOTE — Progress Notes (Signed)
Subjective:  Patient ID: Alexandra Wu, female    DOB: 08-05-1970  Age: 52 y.o. MRN: [id number]  CC: Hypertension   HPI Westley Lefler de Lyn Records is a 52 y.o. year old female with a history of hypertension here for an office visit.  Interval History:  For the last week she has has had a sorethroat, has also had nasal congestion, sneezing, sinus pressure.  She sometimes coughs with production of yellowish phlegm.  Has also had a subjective fever.  But no wheezing or dyspnea she has nausea as well as some malaise.  Her BP is elevated and has also been elevated at home.  Her med list reveals she should be on carvedilol and spironolactone but she only has carvedilol with her site is on the medication she has been taking. She denies presence of dizziness, headache, blurry vision. She was previously on losartan and metoprolol and complained that it caused muscle cramps.  This was changed to amlodipine but then she complained of pedal edema with amlodipine in the past and refused to take it.  Metoprolol was switched to carvedilol. Past Medical History:  Diagnosis Date   Appendicitis, acute 04/23/2013   Lap appendectomy on 03/23/13   Dyslipidemia 03/24/2017   Hyperglycemia 02/17/2017   Hypertension    Nodular goiter 03/24/2017   Thyromegaly 02/17/2017    Past Surgical History:  Procedure Laterality Date   LAPAROSCOPIC APPENDECTOMY N/A 04/23/2013   Procedure: APPENDECTOMY LAPAROSCOPIC;  Surgeon: Merrie Roof, MD;  Location: [facility] OR;  Service: General;  Laterality: N/A;   TUBAL LIGATION  1997    Family History  Problem Relation Age of Onset   Arthritis Mother    Hypertension Mother    Arthritis Sister    Diabetes Sister    Heart disease Brother    Arthritis Sister    Headache Daughter     Social History   Socioeconomic History   Marital status: Married    Spouse name: Scientist, research (medical)   Number of children: 3   Years of education: 1   Highest education level:  Not on file  Occupational History   Occupation: Arboriculturist for offices  Tobacco Use   Smoking status: Never   Smokeless tobacco: Never  Vaping Use   Vaping Use: Never used  Substance and Sexual Activity   Alcohol use: No   Drug use: No   Sexual activity: Yes    Birth control/protection: Surgical  Other Topics Concern   Not on file  Social History Narrative   Originally from [place]   Came to [place]. In 2006   Lives at home with husband   Social Determinants of Health   Financial Resource Strain: Low Risk  (03/03/2018)   Overall Financial Resource Strain (CARDIA)    Difficulty of Paying Living Expenses: Not very hard  Food Insecurity: No Food Insecurity (03/03/2018)   Hunger Vital Sign    Worried About Running Out of Food in the Last Year: Never true    Ran Out of Food in the Last Year: Never true  Transportation Needs: No Transportation Needs (03/03/2018)   PRAPARE - Hydrologist (Medical): No    Lack of Transportation (Non-Medical): No  Physical Activity: Sufficiently Active (03/03/2018)   Exercise Vital Sign    Days of Exercise per Week: 7 days    Minutes of Exercise per Session: 30 min  Stress: Not on file  Social Connections: Not on file    No  Known Allergies  Outpatient Medications Prior to Visit  Medication Sig Dispense Refill   carvedilol (COREG) 6.25 MG tablet Take 1 tablet (6.25 mg total) by mouth 2 (two) times daily with a meal. 60 tablet 3   Omega-3 Fatty Acids (OMEGA-3 FISH OIL PO) Take by mouth daily.     spironolactone (ALDACTONE) 50 MG tablet Take 1 tablet (50 mg total) by mouth daily. 90 tablet 1   tiZANidine (ZANAFLEX) 4 MG tablet Take 1 tablet (4 mg total) by mouth every 8 (eight) hours as needed for muscle spasms. 90 tablet 1   cetirizine (ZYRTEC) 10 MG tablet Take 1 tablet (10 mg total) by mouth daily. (Patient not taking: Reported on 06/17/2022) 30 tablet 11   meloxicam (MOBIC) 7.5 MG tablet Take 1 tablet (7.5 mg total)  by mouth daily. (Patient not taking: Reported on 06/17/2022) 30 tablet 1   Multiple Vitamins-Minerals (MULTIVITAMIN WITH MINERALS) tablet Take 1 tablet by mouth daily. (Patient not taking: Reported on 06/17/2022)     No facility-administered medications prior to visit.     ROS Review of Systems  Constitutional:  Negative for activity change and appetite change.  HENT:  Positive for congestion, sinus pressure and sore throat.   Respiratory:  Positive for cough. Negative for chest tightness, shortness of breath and wheezing.   Cardiovascular:  Negative for chest pain and palpitations.  Gastrointestinal:  Negative for abdominal distention, abdominal pain and constipation.  Genitourinary: Negative.   Musculoskeletal: Negative.   Psychiatric/Behavioral:  Negative for behavioral problems and dysphoric mood.     Objective:  BP (!) 198/108   Pulse 67   Temp 98 F (36.7 C) (Oral)   Ht 5' (1.524 m)   Wt 169 lb 6.4 oz (76.8 kg)   LMP 09/01/2018   SpO2 99%   BMI 33.08 kg/m      09/17/2022    4:05 PM 09/17/2022    3:48 PM 07/07/2022    2:15 PM  BP/Weight  Systolic BP 99991111 [id number] [id number]  Diastolic BP 123XX123 123XX123 [id number]  Wt. (Lbs)  169.4   BMI  33.08 kg/m2       Physical Exam Constitutional:      Appearance: She is well-developed.  Cardiovascular:     Rate and Rhythm: Normal rate.     Heart sounds: Normal heart sounds. No murmur heard. Pulmonary:     Effort: Pulmonary effort is normal.     Breath sounds: Normal breath sounds. No wheezing or rales.  Chest:     Chest wall: No tenderness.  Abdominal:     General: Bowel sounds are normal. There is no distension.     Palpations: Abdomen is soft. There is no mass.     Tenderness: There is no abdominal tenderness.  Musculoskeletal:        General: Normal range of motion.     Right lower leg: No edema.     Left lower leg: No edema.  Neurological:     Mental Status: She is alert and oriented to person, place, and time.  Psychiatric:         Mood and Affect: Mood normal.        Latest Ref Rng & Units 05/06/2022    9:33 AM 09/21/2019    6:04 PM 04/01/2018    9:17 AM  CMP  Glucose 70 - 99 mg/dL 109  138  105   BUN 6 - 24 mg/dL 16  8  11   $ Creatinine 0.57 - 1.00 mg/dL 0.72  0.72  0.53   Sodium 134 - 144 mmol/L 141  138  140   Potassium 3.5 - 5.2 mmol/L 4.4  2.9  3.7   Chloride 96 - 106 mmol/L 101  104  104   CO2 20 - 29 mmol/L 24  24  22   $ Calcium 8.7 - 10.2 mg/dL 10.1  9.1  8.8   Total Protein 6.0 - 8.5 g/dL 7.9   6.9   Total Bilirubin 0.0 - 1.2 mg/dL 0.3   0.3   Alkaline Phos 44 - 121 IU/L 115   72   AST 0 - 40 IU/L 23   19   ALT 0 - 32 IU/L 18   14     Lipid Panel     Component Value Date/Time   CHOL 223 (H) 05/06/2022 0933   TRIG 185 (H) 05/06/2022 0933   HDL 50 05/06/2022 0933   CHOLHDL 4.1 Ratio 06/22/2007 0000   VLDL 38 06/22/2007 0000   LDLCALC 140 (H) 05/06/2022 0933    CBC    Component Value Date/Time   WBC 8.2 05/06/2022 0933   WBC 11.9 (H) 09/21/2019 1804   RBC 5.05 05/06/2022 0933   RBC 4.89 09/21/2019 1804   HGB 14.5 05/06/2022 0933   HCT 43.2 05/06/2022 0933   PLT 289 05/06/2022 0933   MCV 86 05/06/2022 0933   MCH 28.7 05/06/2022 0933   MCH 28.6 09/21/2019 1804   MCHC 33.6 05/06/2022 0933   MCHC 32.5 09/21/2019 1804   RDW 13.4 05/06/2022 0933   LYMPHSABS 2.2 05/06/2022 0933   MONOABS 0.7 04/22/2013 1919   EOSABS 0.1 05/06/2022 0933   BASOSABS 0.0 05/06/2022 0933    Lab Results  Component Value Date   HGBA1C 6.3 (H) 05/06/2022    Assessment & Plan:  1. Essential hypertension Accelerated hypertension Declined amlodipine due to pedal edema She has only been taking carvedilol and has been advised to pick up spironolactone from the pharmacy Valsartan added to regimen. Counseled on blood pressure goal of less than 130/80, low-sodium, DASH diet, medication compliance, 150 minutes of moderate intensity exercise per week. Discussed medication compliance, adverse effects. -  spironolactone (ALDACTONE) 50 MG tablet; Take 1 tablet (50 mg total) by mouth daily.  Dispense: 90 tablet; Refill: 1 - carvedilol (COREG) 6.25 MG tablet; Take 1 tablet (6.25 mg total) by mouth 2 (two) times daily with a meal.  Dispense: 180 tablet; Refill: 1 - valsartan (DIOVAN) 80 MG tablet; Take 1 tablet (80 mg total) by mouth daily.  Dispense: 90 tablet; Refill: 1  2. Acute frontal sinusitis, recurrence not specified - amoxicillin-clavulanate (AUGMENTIN) 875-125 MG tablet; Take 1 tablet by mouth 2 (two) times daily.  Dispense: 20 tablet; Refill: 0  3. Screening for colon cancer - Fecal occult blood, imunochemical    Meds ordered this encounter  Medications   amLODipine (NORVASC) 10 MG tablet    Sig: Take 1 tablet (10 mg total) by mouth daily.    Dispense:  90 tablet    Refill:  1   amoxicillin-clavulanate (AUGMENTIN) 875-125 MG tablet    Sig: Take 1 tablet by mouth 2 (two) times daily.    Dispense:  20 tablet    Refill:  0    Follow-up: Return in about 1 month (around 10/16/2022) for Blood Pressure follow-up. Ok to double book.Charlott Rakes, MD, FAAFP. [facility] and [place], [place]   09/17/2022, 4:07 PM

## 2022-09-25 ENCOUNTER — Encounter: Payer: Self-pay | Admitting: Family Medicine

## 2022-10-13 ENCOUNTER — Ambulatory Visit: Payer: 59 | Admitting: Gastroenterology

## 2022-10-20 ENCOUNTER — Ambulatory Visit (INDEPENDENT_AMBULATORY_CARE_PROVIDER_SITE_OTHER): Payer: 59 | Admitting: Gastroenterology

## 2022-10-20 ENCOUNTER — Encounter: Payer: Self-pay | Admitting: Gastroenterology

## 2022-10-20 VITALS — BP 138/78 | HR 81 | Ht 60.0 in | Wt <= 1120 oz

## 2022-10-20 DIAGNOSIS — R140 Abdominal distension (gaseous): Secondary | ICD-10-CM | POA: Diagnosis not present

## 2022-10-20 DIAGNOSIS — R143 Flatulence: Secondary | ICD-10-CM

## 2022-10-20 DIAGNOSIS — Z1211 Encounter for screening for malignant neoplasm of colon: Secondary | ICD-10-CM | POA: Diagnosis not present

## 2022-10-20 MED ORDER — PANTOPRAZOLE SODIUM 40 MG PO TBEC: 40.0000 mg | DELAYED_RELEASE_TABLET | Freq: Every day | ORAL | 3 refills | Status: DC

## 2022-10-20 MED ORDER — NA SULFATE-K SULFATE-MG SULF 17.5-3.13-1.6 GM/177ML PO SOLN: 1.0000 | Freq: Once | ORAL | 0 refills | Status: AC

## 2022-10-20 NOTE — Progress Notes (Signed)
Referring Provider: Charlott Rakes, MD Primary Care Physician:  Charlott Rakes, MD  Reason for Consultation:  Screening colonoscopy   IMPRESSION:  Heartburn with brash with symptom onset over age 52 Post-prandial distension, gas, and bloating Altered bowel habits - alternating diarrhea and constipation No prior colon cancer screening. Colonoscopy recommended.   PLAN: - Empiric trial of pantoprazole 40 mg QAM  - Abdominal ultrasound - EGD with esophageal, gastric, and duodenal biopsies - Screening colonoscopy   HPI: Alexandra Wu is a 52 y.o. female referred for colon cancer screening.  The history is obtained through the patient with the assistance of a Spanish interpreter and review of her electronic health record.  She has a history of hypertension, dyslipidemia, hyperglycemia, nodular goiter. When our schedulers reached out to her to schedule her screening colonoscopy she reported bloating and diarrhea.  This appointment was scheduled. She is from [place] and has been in the [place] for 15 years.   She reports intermittent heartburn and brash, bloating, gas, and alternating bowel habits between diarrhea and constipation. Two or three times a week, she will develop gas, bloating, and distension that improves within 2-3 hours. She will use camomile tea in the evening to minimize the symptoms. No identified exacerbating or relieving features. She has used an OTC medication to control her symptoms but she is unable to name the medication.  Denies any NSAIDs. Weight is stable. Appetite is good. No blood or mucous in the stool.   No prior colon cancer screening.   No prior abdominal imaging.   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 There is no known family history of colon cancer  or polyps. No family history of stomach cancer or other GI malignancy. No family history of inflammatory bowel disease or celiac.    Past Medical History:  Diagnosis Date   Appendicitis, acute 04/23/2013   Lap appendectomy on 03/23/13   Dyslipidemia 03/24/2017   Hyperglycemia 02/17/2017   Hypertension    Nodular goiter 03/24/2017   Thyromegaly 02/17/2017    Past Surgical History:  Procedure Laterality Date   LAPAROSCOPIC APPENDECTOMY N/A 04/23/2013   Procedure: APPENDECTOMY LAPAROSCOPIC;  Surgeon: Luella Cook III, MD;  Location: [facility];  Service: General;  Laterality: N/A;   TUBAL LIGATION  1997    Current Outpatient Medications  Medication Sig Dispense Refill   amoxicillin-clavulanate (AUGMENTIN) 875-125 MG tablet Take 1 tablet by mouth 2 (two) times daily. 20 tablet 0   carvedilol (COREG) 6.25 MG tablet Take 1 tablet (6.25 mg total) by mouth 2 (two) times daily with a meal. 180 tablet 1   cetirizine (ZYRTEC) 10 MG tablet Take 1 tablet (10 mg total) by mouth daily. 30 tablet 11   Na Sulfate-K Sulfate-Mg Sulf 17.5-3.13-1.6 GM/177ML SOLN Take 1 kit by mouth once for 1 dose. 354 mL 0   Omega-3 Fatty Acids (OMEGA-3 FISH OIL PO) Take by mouth daily.     pantoprazole (PROTONIX) 40 MG tablet Take 1 tablet (40 mg total) by mouth daily. 90 tablet 3   spironolactone (ALDACTONE) 50 MG tablet Take 1 tablet (50 mg total) by mouth daily. 90 tablet 1   tiZANidine (ZANAFLEX) 4 MG tablet Take 1 tablet (4 mg total) by mouth every 8 (eight) hours as needed for muscle spasms. 90 tablet 1  valsartan (DIOVAN) 80 MG tablet Take 1 tablet (80 mg total) by mouth daily. 90 tablet 1   No current facility-administered medications for this visit.    Allergies as of 10/20/2022   (No Known Allergies)    Family History  Problem Relation Age of Onset   Arthritis Mother    Hypertension Mother    Arthritis Sister    Diabetes Sister    Arthritis Sister    Heart disease Brother    Headache Daughter    Colon  cancer Neg Hx    Esophageal cancer Neg Hx    Stomach cancer Neg Hx       Physical Exam: General:   Alert,  well-nourished, pleasant and cooperative in NAD Head:  Normocephalic and atraumatic. Eyes:  Sclera clear, no icterus.   Conjunctiva pink. Ears:  Normal auditory acuity. Nose:  No deformity, discharge,  or lesions. Mouth:  No deformity or lesions.   Neck:  Supple; no masses or thyromegaly. Lungs:  Clear throughout to auscultation.   No wheezes. Heart:  Regular rate and rhythm; no murmurs. Abdomen:  Soft, nontender, nondistended, normal bowel sounds, no rebound or guarding. No hepatosplenomegaly.  I am unable to reproduce her symptoms.  Rectal:  Deferred  Msk:  Symmetrical. No boney deformities LAD: No inguinal or umbilical LAD Extremities:  No clubbing or edema. Neurologic:  Alert and  oriented x4;  grossly nonfocal Skin:  Intact without significant lesions or rashes. Psych:  Alert and cooperative. Normal mood and affect.    Jasma Seevers L. Tarri Glenn, MD, MPH 10/20/2022, 4:13 PM

## 2022-10-20 NOTE — Patient Instructions (Addendum)
Se le ha programado una ecografa abdominal en [facility] (primer piso del hospital) el viernes 29/03/24 a las 8:30 am. Llegue 30 minutos antes de su cita para registrarse. Asegrese de no comer ni beber nada 6 horas antes de su cita. Si necesita reprogramar su cita, comunquese con radiologa al [phone]. Esta prueba suele tardar unos 30 minutos en realizarse.  Hemos enviado los siguientes medicamentos a su farmacia para que los recoja cuando le convenga: Pantoprazol 40 mg al da 30-60 minutos antes del desayuno.  Le han programado una endoscopia y [facility] colonoscopia. Siga las instrucciones escritas que se le dieron en su visita de hoy. Recoja sus suministros de preparacin en la farmacia dentro de los prximos 1 a 3 das. Si [place] inhaladores (incluso solo cuando sea necesario), trigalos con usted el da de su procedimiento.  _______________________________________________________  Lavada Mesi presin arterial en su visita era 140/90 o ms, comunquese con su mdico de atencin primaria para Patent attorney.  _______________________________________________________  Si tiene 65 aos o ms, su ndice de masa corporal debe estar entre 23 y 101. Su ndice de masa corporal es de 1,17 kg/m. Si esto est fuera del rango mencionado anteriormente, considere realizar un seguimiento con su proveedor de Midwife.  Si tiene 49 aos o menos, su ndice de [employer] corporal debe estar entre 97 y 51. Su ndice de masa corporal es de 1,17 kg/m. Si esto est fuera del rango mencionado anteriormente, considere realizar un seguimiento con su proveedor de Midwife.  ________________________________________________________  [employer] de [facility] desean alentarlo a Risk manager MYCHART para comunicarse con los proveedores para solicitudes o preguntas que no sean urgentes. Debido a los Astronomer de espera en el telfono, enviar un mensaje a su proveedor a travs de [employer] puede  ser una forma ms rpida y eficiente de obtener una respuesta. Espere 48 horas hbiles para recibir [employer]. Recuerde que esto es para solicitudes no urgentes. _______________________________________________________   Dennis Bast have been scheduled for an abdominal ultrasound at [facility] (1st floor of hospital) on Friday 10/24/22 at 8:30 am. Please arrive 30 minutes prior to your appointment for registration. Make certain not to have anything to eat or drink 6 hours prior to your appointment. Should you need to reschedule your appointment, please contact radiology at [phone]. This test typically takes about 30 minutes to perform.  We have sent the following medications to your pharmacy for you to pick up at your convenience: Pantoprazole 40 mg daily 30-60 minutes before breakfast.  You have been scheduled for an endoscopy and colonoscopy. Please follow the written instructions given to you at your visit today. Please pick up your prep supplies at the pharmacy within the next 1-3 days. If you use inhalers (even only as needed), please bring them with you on the day of your procedure.  _______________________________________________________  If your blood pressure at your visit was 140/90 or greater, please contact your primary care physician to follow up on this.  _______________________________________________________  If you are age 69 or older, your body mass index should be between 23-30. Your Body mass index is 1.17 kg/m. If this is out of the aforementioned range listed, please consider follow up with your Primary Care Provider.  If you are age 67 or younger, your body mass index should be between 19-25. Your Body mass index is 1.17 kg/m. If this is out of the aformentioned range listed, please consider follow up with your Primary Care Provider.   ________________________________________________________  The [facility] providers would like to encourage you to use  [facility] to communicate with providers for non-urgent requests or questions.  Due to long hold times on the telephone, sending your provider a message by [facility] may be a faster and more efficient way to get a response.  Please allow 48 business hours for a response.  Please remember that this is for non-urgent requests.  _______________________________________________________

## 2022-10-22 ENCOUNTER — Ambulatory Visit: Payer: 59 | Attending: Family Medicine | Admitting: Family Medicine

## 2022-10-22 ENCOUNTER — Encounter: Payer: Self-pay | Admitting: Family Medicine

## 2022-10-22 VITALS — BP 147/86 | HR 66 | Ht 60.0 in | Wt 169.2 lb

## 2022-10-22 DIAGNOSIS — I10 Essential (primary) hypertension: Secondary | ICD-10-CM | POA: Diagnosis not present

## 2022-10-22 DIAGNOSIS — J302 Other seasonal allergic rhinitis: Secondary | ICD-10-CM | POA: Diagnosis not present

## 2022-10-22 MED ORDER — CETIRIZINE HCL 10 MG PO TABS: 10.0000 mg | ORAL_TABLET | Freq: Every day | ORAL | 1 refills | Status: DC

## 2022-10-22 NOTE — Progress Notes (Signed)
Subjective:  Patient ID: Alexandra Wu, female    DOB: [birth year]  Age: 52 y.o. MRN: [id number]  CC: Hypertension   HPI Alexandra Wu de Lyn Records is a 52 y.o. year old female with a history of hypertension here for an office visit.  Interval History:  She would like a Prescription for allergies as she has been experiencing tearing of her eyes, rhinorrhea. BP at home was 127/89 this morning and she states she has been taking Carvedilol and Spironolactone and discontinued Valsartan after 3 weeks due to the fact that her BP at home has been good. Denies presence of additional concerns.  Past Medical History:  Diagnosis Date   Appendicitis, acute 04/23/2013   Lap appendectomy on 03/23/13   Dyslipidemia 03/24/2017   Hyperglycemia 02/17/2017   Hypertension    Nodular goiter 03/24/2017   Thyromegaly 02/17/2017    Past Surgical History:  Procedure Laterality Date   LAPAROSCOPIC APPENDECTOMY N/A 04/23/2013   Procedure: APPENDECTOMY LAPAROSCOPIC;  Surgeon: Merrie Roof, MD;  Location: [facility];  Service: General;  Laterality: N/A;   TUBAL LIGATION  1997    Family History  Problem Relation Age of Onset   Arthritis Mother    Hypertension Mother    Arthritis Sister    Diabetes Sister    Arthritis Sister    Heart disease Brother    Headache Daughter    Colon cancer Neg Hx    Esophageal cancer Neg Hx    Stomach cancer Neg Hx     Social History   Socioeconomic History   Marital status: Married    Spouse name: Scientist, research (medical)   Number of children: 3   Years of education: 1   Highest education level: Not on file  Occupational History   Occupation: Arboriculturist for offices   Occupation: not working  Tobacco Use   Smoking status: Never   Smokeless tobacco: Never  Vaping Use   Vaping Use: Never used  Substance and Sexual Activity   Alcohol use: No   Drug use: No   Sexual activity: Yes    Birth control/protection: Surgical  Other Topics Concern   Not  on file  Social History Narrative   Originally from [place]   Came to [place]. In 2006   Lives at home with husband   Social Determinants of Health   Financial Resource Strain: Low Risk  (10/22/2022)   Overall Financial Resource Strain (CARDIA)    Difficulty of Paying Living Expenses: Not very hard  Food Insecurity: No Food Insecurity (10/22/2022)   Hunger Vital Sign    Worried About Running Out of Food in the Last Year: Never true    Ran Out of Food in the Last Year: Never true  Transportation Needs: No Transportation Needs (10/22/2022)   PRAPARE - Hydrologist (Medical): No    Lack of Transportation (Non-Medical): No  Physical Activity: Insufficiently Active (10/22/2022)   Exercise Vital Sign    Days of Exercise per Week: 3 days    Minutes of Exercise per Session: 40 min  Stress: No Stress Concern Present (10/22/2022)   [place]    Feeling of Stress : Only a little  Social Connections: Unknown (10/22/2022)   Social Connection and Isolation Panel [NHANES]    Frequency of Communication with Friends and Family: Three times a week    Frequency of Social Gatherings with Friends and Family: Twice a week  Attends Religious Services: More than 4 times per year    Active Member of Clubs or Organizations: Yes    Attends Archivist Meetings: More than 4 times per year    Marital Status: Not on file    No Known Allergies  Outpatient Medications Prior to Visit  Medication Sig Dispense Refill   carvedilol (COREG) 6.25 MG tablet Take 1 tablet (6.25 mg total) by mouth 2 (two) times daily with a meal. 180 tablet 1   Omega-3 Fatty Acids (OMEGA-3 FISH OIL PO) Take by mouth daily.     pantoprazole (PROTONIX) 40 MG tablet Take 1 tablet (40 mg total) by mouth daily. 90 tablet 3   spironolactone (ALDACTONE) 50 MG tablet Take 1 tablet (50 mg total) by mouth daily. 90 tablet 1   tiZANidine  (ZANAFLEX) 4 MG tablet Take 1 tablet (4 mg total) by mouth every 8 (eight) hours as needed for muscle spasms. 90 tablet 1   valsartan (DIOVAN) 80 MG tablet Take 1 tablet (80 mg total) by mouth daily. 90 tablet 1   amoxicillin-clavulanate (AUGMENTIN) 875-125 MG tablet Take 1 tablet by mouth 2 (two) times daily. (Patient not taking: Reported on 10/22/2022) 20 tablet 0   cetirizine (ZYRTEC) 10 MG tablet Take 1 tablet (10 mg total) by mouth daily. (Patient not taking: Reported on 10/22/2022) 30 tablet 11   No facility-administered medications prior to visit.     ROS Review of Systems  Constitutional:  Negative for activity change and appetite change.  HENT:  Negative for sinus pressure and sore throat.   Respiratory:  Negative for chest tightness, shortness of breath and wheezing.   Cardiovascular:  Negative for chest pain and palpitations.  Gastrointestinal:  Negative for abdominal distention, abdominal pain and constipation.  Genitourinary: Negative.   Musculoskeletal: Negative.   Psychiatric/Behavioral:  Negative for behavioral problems and dysphoric mood.     Objective:  BP (!) 147/86   Pulse 66   Ht 5' (1.524 m)   Wt 169 lb 3.2 oz (76.7 kg)   LMP 09/01/2018   SpO2 100%   BMI 33.04 kg/m      10/22/2022    2:45 PM 10/20/2022    3:05 PM 09/17/2022    4:05 PM  BP/Weight  Systolic BP [id number]  Diastolic BP 86 78 [id number]  Wt. (Lbs) 169.2 6   BMI 33.04 kg/m2 1.17 kg/m2       Physical Exam Constitutional:      Appearance: She is well-developed.  Cardiovascular:     Rate and Rhythm: Normal rate.     Heart sounds: Normal heart sounds. No murmur heard. Pulmonary:     Effort: Pulmonary effort is normal.     Breath sounds: Normal breath sounds. No wheezing or rales.  Chest:     Chest wall: No tenderness.  Abdominal:     General: Bowel sounds are normal. There is no distension.     Palpations: Abdomen is soft. There is no mass.     Tenderness: There is no abdominal tenderness.   Musculoskeletal:        General: Normal range of motion.     Right lower leg: No edema.     Left lower leg: No edema.  Neurological:     Mental Status: She is alert and oriented to person, place, and time.  Psychiatric:        Mood and Affect: Mood normal.        Latest Ref Rng & Units 05/06/2022  9:33 AM 09/21/2019    6:04 PM 04/01/2018    9:17 AM  CMP  Glucose 70 - 99 mg/dL 109  138  105   BUN 6 - 24 mg/dL 16  8  11    Creatinine 0.57 - 1.00 mg/dL 0.72  0.72  0.53   Sodium 134 - 144 mmol/L 141  138  140   Potassium 3.5 - 5.2 mmol/L 4.4  2.9  3.7   Chloride 96 - 106 mmol/L 101  104  104   CO2 20 - 29 mmol/L 24  24  22    Calcium 8.7 - 10.2 mg/dL 10.1  9.1  8.8   Total Protein 6.0 - 8.5 g/dL 7.9   6.9   Total Bilirubin 0.0 - 1.2 mg/dL 0.3   0.3   Alkaline Phos 44 - 121 IU/L 115   72   AST 0 - 40 IU/L 23   19   ALT 0 - 32 IU/L 18   14     Lipid Panel     Component Value Date/Time   CHOL 223 (H) 05/06/2022 0933   TRIG 185 (H) 05/06/2022 0933   HDL 50 05/06/2022 0933   CHOLHDL 4.1 Ratio 06/22/2007 0000   VLDL 38 06/22/2007 0000   LDLCALC 140 (H) 05/06/2022 0933    CBC    Component Value Date/Time   WBC 8.2 05/06/2022 0933   WBC 11.9 (H) 09/21/2019 1804   RBC 5.05 05/06/2022 0933   RBC 4.89 09/21/2019 1804   HGB 14.5 05/06/2022 0933   HCT 43.2 05/06/2022 0933   PLT 289 05/06/2022 0933   MCV 86 05/06/2022 0933   MCH 28.7 05/06/2022 0933   MCH 28.6 09/21/2019 1804   MCHC 33.6 05/06/2022 0933   MCHC 32.5 09/21/2019 1804   RDW 13.4 05/06/2022 0933   LYMPHSABS 2.2 05/06/2022 0933   MONOABS 0.7 04/22/2013 1919   EOSABS 0.1 05/06/2022 0933   BASOSABS 0.0 05/06/2022 0933    Lab Results  Component Value Date   HGBA1C 6.3 (H) 05/06/2022    Assessment & Plan:  1. Essential hypertension Uncontrolled Surprisingly she states blood pressures at home have been normal She has not been taking her valsartan due to normal blood pressures at home. Continue to keep  home blood pressure log and hold off on valsartan. I will have her follow-up with the clinical pharmacist after she has been on carvedilol and spironolactone and if blood pressure is still elevated she will need to be restarted on valsartan probably at a lower dose. Counseled on blood pressure goal of less than 130/80, low-sodium, DASH diet, medication compliance, 150 minutes of moderate intensity exercise per week. Discussed medication compliance, adverse effects.  2. Seasonal allergies - cetirizine (ZYRTEC) 10 MG tablet; Take 1 tablet (10 mg total) by mouth daily.  Dispense: 90 tablet; Refill: 1   Health Care Maintenance: Referred for colonoscopy in 05/2022 and she is scheduled for 10/2022 Meds ordered this encounter  Medications   cetirizine (ZYRTEC) 10 MG tablet    Sig: Take 1 tablet (10 mg total) by mouth daily.    Dispense:  90 tablet    Refill:  1    Follow-up: Return in about 3 months (around 01/22/2023) for Chronic medical conditions.       Charlott Rakes, MD, FAAFP. [facility] and [place], [place]   10/22/2022, 3:08 PM

## 2022-10-22 NOTE — Patient Instructions (Signed)
Cmo controlar su hipertensin Managing Your Hypertension La hipertensin, tambin conocida como presin arterial alta, se produce cuando la sangre ejerce presin contra las paredes de las arterias con demasiada fuerza. Las arterias son los vasos sanguneos que transportan la sangre desde el corazn hacia todas las partes del cuerpo. La hipertensin hace que el corazn haga ms esfuerzo para bombear la sangre y puede provocar que las arterias se estrechen o endurezcan. Qu significan las lecturas de la presin arterial Una lectura de la presin arterial consta de un nmero ms alto sobre un nmero ms bajo. El primer nmero, o nmero superior, es la presin sistlica. Es la medida de la presin de las arterias cuando el corazn late. El segundo nmero, o nmero inferior, es la presin diastlica. Es la medida de la presin en las arterias cuando el corazn se relaja. Para la mayora de las personas, una presin arterial normal est por debajo de 120/80. La presin arterial deseada puede variar en funcin de las enfermedades, la edad y otros factores personales. La presin arterial se clasifica en cuatro etapas. Sobre la base de la lectura de su presin arterial, el mdico puede usar las siguientes etapas para determinar si necesita tratamiento y de qu tipo. La presin sistlica y la presin diastlica se miden en una unidad llamada milmetros de mercurio (mm Hg). Normal Presin sistlica: por debajo de 120. Presin diastlica: por debajo de 80. Elevada Presin sistlica: 120-129. Presin diastlica: por debajo de 80. Etapa 1 de hipertensin Presin sistlica: 130-139. Presin diastlica: 80-89. Etapa 2 de hipertensin Presin sistlica: 140 o ms. Presin diastlica: 90 o ms. Cmo puede afectarme esta enfermedad? Controlar la hipertensin es muy importante. Con el transcurso del tiempo, la hipertensin puede daar las arterias y disminuir el flujo de sangre hacia partes del cuerpo que  incluyen el cerebro, el corazn y los riones. Tener hipertensin no tratada o no controlada puede causar: Infarto de miocardio. Accidente cerebrovascular. Debilitamiento de los vasos sanguneos (aneurisma). Insuficiencia cardaca. Dao renal. Dao ocular. Problemas de memoria y concentracin. Demencia vascular. Qu medidas puedo tomar para controlar esta afeccin? La hipertensin se puede controlar haciendo cambios en el estilo de vida y, posiblemente, tomando medicamentos. Su mdico le ayudar a crear un plan para bajar la presin arterial al rango normal. Es posible que lo deriven para que reciba asesoramiento sobre una dieta saludable y actividad fsica. Nutricin  Siga una dieta con alto contenido de fibras y potasio, y con bajo contenido de sal (sodio), azcar agregada y grasas. Un ejemplo de plan de alimentacin se denomina dieta DASH. DASH es la sigla en ingls de "Enfoques alimentarios para detener la hipertensin". Para alimentarse de esta manera: Coma mucha fruta y verdura fresca. Trate de que la mitad del plato de cada comida sea de frutas y verduras. Coma cereales integrales, como pasta integral, arroz integral o pan integral. Llene aproximadamente un cuarto del plato con cereales integrales. Consuma productos lcteos descremados. Evite la ingesta de cortes de carne grasa, carne procesada o curada, y carne de ave con piel. Llene aproximadamente un cuarto del plato con protenas magras, como pescado, pollo sin piel, frijoles, huevos o tofu. Evite ingerir alimentos prehechos y procesados. En general, estos tienen mayor cantidad de sodio, azcar agregada y grasa. Reduzca su ingesta diaria de sodio. Muchas personas que tienen hipertensin deben comer menos de 1500 mg de sodio por da. Estilo de vida  Trabaje con su mdico para mantener un peso saludable o perder peso. Pregntele cul es el peso recomendado   para usted. Realice al menos 30 minutos de ejercicio que haga que se acelere  su corazn (ejercicio aerbico) la mayora de los das de la semana. Estas actividades pueden incluir caminar, nadar o andar en bicicleta. Incluya ejercicios para fortalecer sus msculos (ejercicios de resistencia), como levantamiento de pesas, como parte de su rutina semanal de ejercicios. Intente realizar 30 minutos de este tipo de ejercicios al menos tres das a la semana. No consuma ningn producto que contenga nicotina o tabaco. Estos productos incluyen cigarrillos, tabaco para mascar y aparatos de vapeo, como los cigarrillos electrnicos. Si necesita ayuda para dejar de consumir estos productos, consulte al mdico. Controle las enfermedades a largo plazo (crnicas), como el colesterol alto o la diabetes. Identifique sus causas de estrs y encuentre maneras de controlar el estrs. Esto puede incluir meditacin, respiracin profunda o hacerse tiempo para realizar actividades divertidas. Consumo de alcohol No beba alcohol si: Su mdico le indica no hacerlo. Est embarazada, puede estar embarazada o est tratando de quedar embarazada. Si bebe alcohol: Limite la cantidad que bebe a lo siguiente: De 0 a 1 medida por da para las mujeres. De 0 a 2 medidas por da para los hombres. Sepa cunta cantidad de alcohol hay en las bebidas que toma. En los [place], una medida equivale a una botella de cerveza de 12 oz (355 ml), un vaso de vino de 5 oz (148 ml) o un vaso de una bebida alcohlica de alta graduacin de 1 oz (44 ml). Medicamentos El mdico puede recetarle medicamentos si los cambios en el estilo de vida no son suficientes para lograr controlar la presin arterial y si: Su presin arterial sistlica es de 130 o ms. Su presin arterial diastlica es de 80 o ms. Use los medicamentos solamente como se lo haya indicado el mdico. Siga cuidadosamente las indicaciones. Los medicamentos para la presin arterial deben tomarse como se lo haya indicado el mdico. Los medicamentos pierden eficacia  al omitir las dosis. El hecho de omitir las dosis tambin aumenta el riesgo de otros problemas. Monitoreo Antes de controlarse la presin arterial: No fume, no consuma bebidas con cafena ni haga ejercicio dentro de los 30 minutos antes de tomar la medicin. Vaya al bao y vace la vejiga (orine). Permanezca sentado tranquilamente durante al menos 5 minutos antes de tomar las mediciones. Contrlese la presin arterial en su casa segn las indicaciones del mdico. Para hacer esto: Sintese con la espalda recta y con apoyo. Coloque los pies planos en el piso. No se cruce de piernas. Apoye el brazo sobre una superficie plana, como una mesa. Asegrese de que la parte superior del brazo est al nivel del corazn. Cada vez que tome una medicin, tome dos o tres lecturas con un minuto de separacin y anote los resultados. Posiblemente tambin sea necesario que el mdico le controle la presin arterial de manera regular. Informacin general Hable con su mdico acerca de la dieta, hbitos de ejercicio y otros factores del estilo de vida que pueden contribuir a la hipertensin. Revise con su mdico todos los medicamentos que toma ya que puede haber efectos secundarios o interacciones. Concurra a todas las visitas de seguimiento. El mdico puede ayudarle a crear y ajustar su plan para controlar la presin arterial alta. Dnde obtener ms informacin National Heart, Lung, and Blood Institute (Instituto Nacional del Corazn, los Pulmones y la Sangre): www.nhlbi.nih.gov American Heart Association (Asociacin Estadounidense del Corazn): www.heart.org Comunquese con un mdico si: Piensa que tiene una reaccin alrgica a los medicamentos   que ha tomado. Tiene dolores de cabeza frecuentes (recurrentes). Siente mareos. Tiene hinchazn en los tobillos. Tiene problemas de visin. Solicite ayuda de inmediato si: Siente un dolor de cabeza intenso o confusin. Siente debilidad inusual, adormecimiento o que se  desmayar. Siente un dolor intenso en el pecho o el abdomen. Vomita repetidas veces. Tiene dificultad para respirar. Estos sntomas pueden indicar una emergencia. Solicite ayuda de inmediato. Llame al 911. No espere a ver si los sntomas desaparecen. No conduzca por sus propios medios hasta el hospital. Resumen La hipertensin se produce cuando la sangre bombea en las arterias con mucha fuerza. Si esta afeccin no se controla, podra correr riesgo de tener complicaciones graves. La presin arterial deseada puede variar en funcin de las enfermedades, la edad y otros factores personales. Para la mayora de las personas, una presin arterial normal es menor que 120/80. La hipertensin se puede controlar mediante cambios en el estilo de vida, tomando medicamentos, o ambas cosas. Los cambios en el estilo de vida para ayudar a controlar la hipertensin incluyen prdida de peso, seguir una dieta saludable, con bajo contenido de sodio, hacer ms ejercicio, dejar de fumar y limitar el consumo de alcohol. Esta informacin no tiene como fin reemplazar el consejo del mdico. Asegrese de hacerle al mdico cualquier pregunta que tenga. Document Revised: 04/22/2021 Document Reviewed: 04/22/2021 Elsevier Patient Education  2023 Elsevier Inc.  

## 2022-10-24 ENCOUNTER — Ambulatory Visit (HOSPITAL_COMMUNITY)
Admission: RE | Admit: 2022-10-24 | Discharge: 2022-10-24 | Disposition: A | Discharge status: Home / Self Care | Payer: 59 | Source: Ambulatory Visit | Attending: Gastroenterology | Admitting: Gastroenterology

## 2022-10-24 DIAGNOSIS — R140 Abdominal distension (gaseous): Secondary | ICD-10-CM | POA: Diagnosis not present

## 2022-10-24 DIAGNOSIS — R143 Flatulence: Secondary | ICD-10-CM | POA: Diagnosis not present

## 2022-10-28 ENCOUNTER — Encounter: Payer: Self-pay | Admitting: Family Medicine

## 2022-10-28 ENCOUNTER — Ambulatory Visit: Payer: 59 | Attending: Family Medicine | Admitting: Pharmacist

## 2022-10-28 ENCOUNTER — Encounter: Payer: Self-pay | Admitting: Pharmacist

## 2022-10-28 VITALS — BP 160/89 | HR 64

## 2022-10-28 DIAGNOSIS — I10 Essential (primary) hypertension: Secondary | ICD-10-CM

## 2022-10-28 DIAGNOSIS — I714 Abdominal aortic aneurysm, without rupture, unspecified: Secondary | ICD-10-CM | POA: Insufficient documentation

## 2022-10-28 NOTE — Progress Notes (Signed)
S:     No chief complaint on file.  52 y.o. female who presents for hypertension evaluation, education, and management.  PMH is significant for HTN, hyperlipidemia, migraines, AAA, thyromegaly, seasonal allergies.  Patient was referred and last seen by Primary Care Provider, Dr. Alvis Lemmings, on 10/22/2022.   At last visit, pt was continued on spironolactone and carvedilol. She reported good BP control at home but clinic BP was elevated. She was instructed to continue with spironolactone and carvedilol only.   Today, patient arrives in good spirits and presents without assistance. Denies dizziness, headache, blurred vision, swelling. Patient reports hypertension is longstanding.  Medication adherence reported. Patient has taken BP medications today. However, she continues to take the valsartan.   Family/Social history:  Fhx: HTN, DM, HA, arthritis Tobacco: never smoker   Alcohol: none reported   Current antihypertensives include: carvedilol 6.25 mg BID, spironolactone 50 mg daily, valsartan 80 mg daily   Reported home BP readings:  -SBP: 113-140 mmHg -DBP: 66-74 mmHg  Patient reported dietary habits:  -Compliant with salt restriction -Denies drinking caffeine   Patient-reported exercise habits: none reported   O:  Vitals:   10/28/22 1552 10/28/22 1601  BP: (!) 171/97 (!) 160/89  Pulse:  64    Last 3 Office BP readings: BP Readings from Last 3 Encounters:  10/28/22 (!) 171/97  10/22/22 (!) 147/86  10/20/22 138/78    BMET    Component Value Date/Time   NA 141 05/06/2022 0933   K 4.4 05/06/2022 0933   CL 101 05/06/2022 0933   CO2 24 05/06/2022 0933   GLUCOSE 109 (H) 05/06/2022 0933   GLUCOSE 138 (H) 09/21/2019 1804   BUN 16 05/06/2022 0933   CREATININE 0.72 05/06/2022 0933   CALCIUM 10.1 05/06/2022 0933   GFRNONAA >60 09/21/2019 1804   GFRAA >60 09/21/2019 1804    Renal function: CrCl cannot be calculated (Patient's most recent lab result is older than the  maximum 21 days allowed.).  Clinical ASCVD: No  The 10-year ASCVD risk score (Arnett DK, et al., 2019) is: 4.9%   Values used to calculate the score:     Age: 1 years     Sex: Female     Is Non-Hispanic African American: No     Diabetic: No     Tobacco smoker: No     Systolic Blood Pressure: 171 mmHg     Is BP treated: Yes     HDL Cholesterol: 50 mg/dL     Total Cholesterol: 223 mg/dL  Patient is participating in a Managed Medicaid Plan: No    A/P: Hypertension diagnosed currently poorly controlled on current medications. BP goal < 130/80 mmHg. Medication adherence appears appropriate, however, she is taking valsartan. Despite this, her BP is still quite elevated. She does not have her cuff with her but reported home BP readings are must better than clinic. We will have her return with her BP cuff so we can validate it.  -Continued current regimen for now.  -F/u labs ordered - none -Counseled on lifestyle modifications for blood pressure control including reduced dietary sodium, increased exercise, adequate sleep. -Encouraged patient to check BP at home and bring log of readings to next visit. Counseled on proper use of home BP cuff.   Results reviewed and written information provided.    Written patient instructions provided. Patient verbalized understanding of treatment plan.  Total time in face to face counseling 30 minutes.    Follow-up:  Pharmacist in 1 month with  BP cuff for validation. PCP clinic visit 01/26/2023.   Butch Penny, PharmD, Patsy [place], CPP Clinical Pharmacist [facility] [phone]

## 2022-11-11 ENCOUNTER — Ambulatory Visit (AMBULATORY_SURGERY_CENTER): Payer: 59 | Admitting: Gastroenterology

## 2022-11-11 ENCOUNTER — Encounter: Payer: Self-pay | Admitting: Gastroenterology

## 2022-11-11 VITALS — BP 117/63 | HR 52 | Temp 98.6°F | Resp 17 | Ht 61.02 in | Wt 168.0 lb

## 2022-11-11 DIAGNOSIS — K2950 Unspecified chronic gastritis without bleeding: Secondary | ICD-10-CM

## 2022-11-11 DIAGNOSIS — R140 Abdominal distension (gaseous): Secondary | ICD-10-CM | POA: Diagnosis not present

## 2022-11-11 DIAGNOSIS — K2289 Other specified disease of esophagus: Secondary | ICD-10-CM | POA: Diagnosis not present

## 2022-11-11 DIAGNOSIS — R12 Heartburn: Secondary | ICD-10-CM | POA: Diagnosis not present

## 2022-11-11 DIAGNOSIS — Z1211 Encounter for screening for malignant neoplasm of colon: Secondary | ICD-10-CM

## 2022-11-11 DIAGNOSIS — I10 Essential (primary) hypertension: Secondary | ICD-10-CM | POA: Diagnosis not present

## 2022-11-11 MED ORDER — SODIUM CHLORIDE 0.9 % IV SOLN: 500.0000 mL | Freq: Once | INTRAVENOUS | Status: DC

## 2022-11-11 NOTE — Progress Notes (Signed)
Indication for upper endoscopy: Heartburn with brash, gas, and bloating  Indication for colonoscopy: Colon cancer screening  See my 10/20/22 office note for complete details. There has been no change in history or physical exam. She remains an appropriate candidate for monitored anesthesia care in the [facility].

## 2022-11-11 NOTE — Progress Notes (Signed)
Report to PACU, RN, vss, BBS= Clear.  

## 2022-11-11 NOTE — Progress Notes (Signed)
VS by CW  Pt's states no medical or surgical changes since previsit or office visit.  Interpreter used today at the [facility] for this pt.  Interpreter's name is- Mariel  

## 2022-11-11 NOTE — Op Note (Signed)
[facility] Patient Name: Alexandra Wu Procedure Date: 11/11/2022 2:36 PM MRN: [id number] Endoscopist: Tressia Danas MD, MD, [id number] Age: 51 Referring MD:  Date of Birth: 03/30/1971 Gender: Female Account #: [id number] Procedure:                Colonoscopy Indications:              Screening for colorectal malignant neoplasm, This                            is the patient's first colonoscopy                           No known family history of colon cancer or polyps Medicines:                Monitored Anesthesia Care Procedure:                Pre-Anesthesia Assessment:                           - Prior to the procedure, a History and Physical                            was performed, and patient medications and                            allergies were reviewed. The patient's tolerance of                            previous anesthesia was also reviewed. The risks                            and benefits of the procedure and the sedation                            options and risks were discussed with the patient.                            All questions were answered, and informed consent                            was obtained. Prior Anticoagulants: The patient has                            taken no anticoagulant or antiplatelet agents. ASA                            Grade Assessment: II - A patient with mild systemic                            disease. After reviewing the risks and benefits,                            the patient was deemed in satisfactory condition to  undergo the procedure.                           After obtaining informed consent, the colonoscope                            was passed under direct vision. Throughout the                            procedure, the patient's blood pressure, pulse, and                            oxygen saturations were monitored continuously. The                             Olympus CF-HQ190L ([id number]) Colonoscope was                            introduced through the anus and advanced to the 3                            cm into the ileum. A second forward view of the                            right colon was performed. The colonoscopy was                            performed without difficulty. The patient tolerated                            the procedure well. The quality of the bowel                            preparation was excellent. The terminal ileum,                            ileocecal valve, appendiceal orifice, and rectum                            were photographed. Scope In: 3:09:08 PM Scope Out: 3:17:47 PM Scope Withdrawal Time: 0 hours 5 minutes 48 seconds  Total Procedure Duration: 0 hours 8 minutes 39 seconds  Findings:                 The perianal and digital rectal examinations were                            normal.                           The entire examined colon appeared normal on direct                            and retroflexion views. Complications:            No immediate complications. Estimated Blood  Loss:     Estimated blood loss: none. Impression:               - The entire examined colon is normal on direct and                            retroflexion views.                           - No specimens collected. Recommendation:           - Patient has a contact number available for                            emergencies. The signs and symptoms of potential                            delayed complications were discussed with the                            patient. Return to normal activities tomorrow.                            Written discharge instructions were provided to the                            patient.                           - Resume previous diet.                           - Continue present medications.                           - Repeat colonoscopy in 10 years for surveillance,                             earlier with new symptoms.                           - Emerging evidence supports eating a diet of                            fruits, vegetables, grains, calcium, and yogurt                            while reducing red meat and alcohol may reduce the                            risk of colon cancer.                           - Thank you for allowing me to be involved in your                            colon cancer  prevention. Tressia Danas MD, MD 11/11/2022 3:22:53 PM This report has been signed electronically.

## 2022-11-11 NOTE — Op Note (Signed)
[facility] Patient Name: Alexandra Wu Procedure Date: 11/11/2022 2:41 PM MRN: [id number] Endoscopist: Tressia Danas MD, MD, [id number] Age: 52 Referring MD:  Date of Birth: [birth year] Gender: Female Account #: [id number] Procedure:                Upper GI endoscopy Indications:              Heartburn, Abdominal bloating, Eructation Medicines:                Monitored Anesthesia Care Procedure:                Pre-Anesthesia Assessment:                           - Prior to the procedure, a History and Physical                            was performed, and patient medications and                            allergies were reviewed. The patient's tolerance of                            previous anesthesia was also reviewed. The risks                            and benefits of the procedure and the sedation                            options and risks were discussed with the patient.                            All questions were answered, and informed consent                            was obtained. Prior Anticoagulants: The patient has                            taken no anticoagulant or antiplatelet agents. ASA                            Grade Assessment: II - A patient with mild systemic                            disease. After reviewing the risks and benefits,                            the patient was deemed in satisfactory condition to                            undergo the procedure.                           After obtaining informed consent, the endoscope was  passed under direct vision. Throughout the                            procedure, the patient's blood pressure, pulse, and                            oxygen saturations were monitored continuously. The                            GIF [id number] #[id number] was introduced through the                            mouth, and advanced to the second part of duodenum.                             The upper GI endoscopy was accomplished without                            difficulty. The patient tolerated the procedure                            well. Scope In: Scope Out: Findings:                 Localized mild mucosal changes characterized by                            congestion were found in the distal esophagus.                            Biopsies were taken with a cold forceps for                            histology. Estimated blood loss was minimal.                           Diffuse mild inflammation characterized by                            erythema, friability and granularity was found in                            the gastric body and in the gastric antrum.                            Biopsies were taken from the antrum, body, and                            fundus with a cold forceps for histology. Estimated                            blood loss was minimal.                           The examined duodenum was normal.  Biopsies were                            taken with a cold forceps for histology. Estimated                            blood loss was minimal.                           The cardia and gastric fundus were normal on                            retroflexion. Complications:            No immediate complications. Estimated Blood Loss:     Estimated blood loss was minimal. Impression:               - Congested mucosa in the esophagus. Biopsied.                           - Gastritis. Biopsied.                           - Normal examined duodenum. Biopsied. Recommendation:           - Patient has a contact number available for                            emergencies. The signs and symptoms of potential                            delayed complications were discussed with the                            patient. Return to normal activities tomorrow.                            Written discharge instructions were provided to the                            patient.                            - Resume previous diet.                           - Continue present medications.                           - Increase pantoprazole to 40 mg BID.                           - Await pathology results.                           - No aspirin, ibuprofen, naproxen, or other  non-steroidal anti-inflammatory drugs.                           - Proceed with colonoscopy as previously planned. Tressia Danas MD, MD 11/11/2022 3:08:00 PM This report has been signed electronically.

## 2022-11-11 NOTE — Patient Instructions (Addendum)
Take your pantoprazole twice daily 1/2 hour before a meal.  No aspirin, aleve or ibuprofen.    USTED TUVO UN PROCEDIMIENTO ENDOSCPICO HOY EN EL [facility]:   Lea el informe del procedimiento que se le entreg para cualquier pregunta especfica sobre lo que se Dentist.  Si el informe del examen no responde a sus preguntas, por favor llame a su gastroenterlogo para aclararlo.  Si usted solicit que no se le den [employer] de lo que se Clinical cytogeneticist en su procedimiento al [employer] va a cuidar, entonces el informe del procedimiento se ha incluido en un sobre sellado para que usted lo revise despus cuando le sea ms conveniente.   LO QUE PUEDE ESPERAR: Algunas sensaciones de hinchazn en el abdomen.  Puede tener ms gases de lo normal.  El caminar puede ayudarle a eliminar el aire que se le puso en el tracto gastrointestinal durante el procedimiento y reducir la hinchazn.  Si le hicieron una endoscopia inferior (como una colonoscopia o una sigmoidoscopia flexible), podra notar manchas de sangre en las heces fecales o en el papel higinico.  Si se someti a una preparacin intestinal para su procedimiento, es posible que no tenga una evacuacin intestinal normal durante [employer].   Tenga en cuenta:  Es posible que note un poco de irritacin y congestin en la nariz o algn drenaje.  Esto es debido al oxgeno [employer] durante su procedimiento.  No hay que preocuparse y esto debe desaparecer ms o Regulatory affairs officer.   SNTOMAS PARA REPORTAR INMEDIATAMENTE:  Despus de una endoscopia inferior (colonoscopia o sigmoidoscopia flexible):  Cantidades excesivas de sangre en las heces fecales  Sensibilidad significativa o empeoramiento de los dolores abdominales   Hinchazn aguda del abdomen que antes no tena   Fiebre de 100F o ms   Despus de la endoscopia superior (EGD)  Vmitos de Retail buyer o material como caf molido   Dolor en el pecho o dolor debajo de los omplatos  que antes no tena   Dolor o dificultad persistente para tragar  Falta de aire que antes no tena   Fiebre de 100F o ms  Heces fecales negras y pegajosas   Para asuntos urgentes o de Associate Professor, puede comunicarse con un gastroenterlogo a cualquier hora llamando al [phone].  DIETA:  Recomendamos una comida pequea al principio, pero luego puede continuar con su dieta normal.  Tome muchos lquidos, Tax adviser las bebidas alcohlicas durante 24 horas.    ACTIVIDAD:  Debe planear tomarse las cosas con calma por el resto del da y no debe CONDUCIR ni usar maquinaria pesada Patent examiner (debido a los medicamentos de sedacin utilizados durante el examen).     SEGUIMIENTO: Nuestro personal llamar al nmero que aparece en su historial al siguiente da hbil de su procedimiento para ver cmo se siente y para responder cualquier pregunta o inquietud que pueda tener con respecto a la informacin que se le dio despus del procedimiento. Si no podemos contactarle, le dejaremos un mensaje.  Sin embargo, si se siente bien y no tiene English as a second language teacher, no es necesario que nos devuelva la llamada.  Asumiremos que ha regresado a sus actividades diarias normales sin incidentes. Si se le tomaron algunas biopsias, le contactaremos por telfono o por carta en las prximas 3 semanas.  Si no ha sabido [employer] biopsias en el transcurso de 3 semanas, por favor llmenos al [phone].   FIRMAS/CONFIDENCIALIDAD: Daphane Shepherd y/o el acompaante que  le cuide han firmado documentos que se ingresarn en su historial mdico electrnico.  Estas firmas atestiguan el hecho de que la informacin anterior    YOU HAD AN ENDOSCOPIC PROCEDURE TODAY AT [facility]:   Refer to the procedure report that was given to you for any specific questions about what was found during the examination.  If the procedure report does not answer your questions, please call your gastroenterologist to clarify.  If you  requested that your care partner not be given the details of your procedure findings, then the procedure report has been included in a sealed envelope for you to review at your convenience later.  YOU SHOULD EXPECT: Some feelings of bloating in the abdomen. Passage of more gas than usual.  Walking can help get rid of the air that was put into your GI tract during the procedure and reduce the bloating. If you had a lower endoscopy (such as a colonoscopy or flexible sigmoidoscopy) you may notice spotting of blood in your stool or on the toilet paper. If you underwent a bowel prep for your procedure, you may not have a normal bowel movement for a few days.  Please Note:  You might notice some irritation and congestion in your nose or some drainage.  This is from the oxygen used during your procedure.  There is no need for concern and it should clear up in a day or so.  SYMPTOMS TO REPORT IMMEDIATELY:  Following lower endoscopy (colonoscopy or flexible sigmoidoscopy):  Excessive amounts of blood in the stool  Significant tenderness or worsening of abdominal pains  Swelling of the abdomen that is new, acute  Fever of 100F or higher  Following upper endoscopy (EGD)  Vomiting of blood or coffee ground material  New chest pain or pain under the shoulder blades  Painful or persistently difficult swallowing  New shortness of breath  Fever of 100F or higher  Black, tarry-looking stools  For urgent or emergent issues, a gastroenterologist can be reached at any hour by calling [phone]. Do not use MyChart messaging for urgent concerns.    DIET:  We do recommend a small meal at first, but then you may proceed to your regular diet.  Drink plenty of fluids but you should avoid alcoholic beverages for 24 hours.  ACTIVITY:  You should plan to take it easy for the rest of today and you should NOT DRIVE or use heavy machinery until tomorrow (because of the sedation medicines used during the test).     FOLLOW UP: Our staff will call the number listed on your records the next business day following your procedure.  We will call around 7:15- 8:00 am to check on you and address any questions or concerns that you may have regarding the information given to you following your procedure. If we do not reach you, we will leave a message.     If any biopsies were taken you will be contacted by phone or by letter within the next 1-3 weeks.  Please call us at [phone] if you have not heard about the biopsies in 3 weeks.    SIGNATURES/CONFIDENTIALITY: You and/or your care partner have signed paperwork which will be entered into your electronic medical record.  These signatures attest to the fact that that the information above on your After Visit Summary has been reviewed and is understood.  Full responsibility of the confidentiality of this discharge information lies with you and/or your care-partner.

## 2022-11-11 NOTE — Progress Notes (Signed)
Called to room to assist during endoscopic procedure.  Patient ID and intended procedure confirmed with present staff. Received instructions for my participation in the procedure from the performing physician.  

## 2022-11-12 ENCOUNTER — Telehealth: Payer: Self-pay | Admitting: *Deleted

## 2022-11-12 NOTE — Telephone Encounter (Signed)
No answer on  follow up call. Left message.   

## 2022-11-14 ENCOUNTER — Telehealth: Payer: Self-pay | Admitting: *Deleted

## 2022-11-14 NOTE — Telephone Encounter (Signed)
Appointment scheduled with Doug Sou, PA on 01/13/23 @ 10 am.

## 2022-11-14 NOTE — Telephone Encounter (Signed)
-----   Message from Tressia Danas, MD sent at 11/11/2022  3:26 PM EDT ----- Office follow-up in 4 to 6 weeks.  Thank you.  KLB

## 2022-11-16 ENCOUNTER — Encounter: Payer: Self-pay | Admitting: Gastroenterology

## 2022-11-27 ENCOUNTER — Ambulatory Visit: Payer: 59 | Attending: Family Medicine | Admitting: Pharmacist

## 2022-11-27 ENCOUNTER — Encounter: Payer: Self-pay | Admitting: Pharmacist

## 2022-11-27 VITALS — BP 144/74 | HR 63

## 2022-11-27 DIAGNOSIS — I10 Essential (primary) hypertension: Secondary | ICD-10-CM

## 2022-11-27 NOTE — Progress Notes (Signed)
     S:     No chief complaint on file.  Interpreter used in today's encounter Kimber Relic: [id number]  52 y.o. female who presents for hypertension evaluation, education, and management.  PMH is significant for HTN, hyperlipidemia, migraines, AAA, thyromegaly, seasonal allergies.  Patient was referred and last seen by Primary Care Provider, Dr. Alvis Lemmings, on 10/22/2022. I saw her on 10/28/2022. Clinic BP was above goal but reported home BP readings were good.    Today, patient arrives in good spirits and presents without assistance. Denies dizziness, headache, blurred vision, swelling. Patient reports hypertension is longstanding.   Medication adherence reported. Patient has taken BP medications today.   Family/Social history:  Fhx: HTN, DM, HA, arthritis Tobacco: never smoker   Alcohol: none reported   Current antihypertensives include: carvedilol 6.25 mg BID, spironolactone 50 mg daily, valsartan 80 mg daily   Reported home BP readings:  -SBP: 120-130s mmHg -DBP: 70s mmHg  Patient reported dietary habits:  -Compliant with salt restriction -Denies drinking caffeine   Patient-reported exercise habits: none reported   O:  Vitals:   11/27/22 1506  BP: (!) 144/74  Pulse: 63    Last 3 Office BP readings: BP Readings from Last 3 Encounters:  11/27/22 (!) 144/74  11/11/22 117/63  10/28/22 (!) 160/89    BMET    Component Value Date/Time   NA 141 05/06/2022 0933   K 4.4 05/06/2022 0933   CL 101 05/06/2022 0933   CO2 24 05/06/2022 0933   GLUCOSE 109 (H) 05/06/2022 0933   GLUCOSE 138 (H) 09/21/2019 1804   BUN 16 05/06/2022 0933   CREATININE 0.72 05/06/2022 0933   CALCIUM 10.1 05/06/2022 0933   GFRNONAA >60 09/21/2019 1804   GFRAA >60 09/21/2019 1804    Renal function: CrCl cannot be calculated (Patient's most recent lab result is older than the maximum 21 days allowed.).  Clinical ASCVD: No  The 10-year ASCVD risk score (Arnett DK, et al., 2019) is: 3.5%   Values used to  calculate the score:     Age: 23 years     Sex: Female     Is Non-Hispanic African American: No     Diabetic: No     Tobacco smoker: No     Systolic Blood Pressure: 144 mmHg     Is BP treated: Yes     HDL Cholesterol: 50 mg/dL     Total Cholesterol: 223 mg/dL  Patient is participating in a Managed Medicaid Plan: No    A/P: Hypertension diagnosed currently above goalon current medications. BP goal < 130/80 mmHg. Medication adherence appears appropriate. Reported home blood pressures readings are at goal but I still cannot verify this. Will have her continue her current regimen and return to see Dr. Alvis Lemmings in July. -Continued current regimen for now.  -F/u labs ordered - none -Counseled on lifestyle modifications for blood pressure control including reduced dietary sodium, increased exercise, adequate sleep. -Encouraged patient to check BP at home and bring log of readings to next visit. Counseled on proper use of home BP cuff.   Results reviewed and written information provided.    Written patient instructions provided. Patient verbalized understanding of treatment plan.  Total time in face to face counseling 30 minutes.    Follow-up:  PCP clinic visit 01/26/2023.   Butch Penny, PharmD, P[place]y [place], CPP Clinical Pharmacist [facility] [phone]

## 2023-01-13 ENCOUNTER — Ambulatory Visit (INDEPENDENT_AMBULATORY_CARE_PROVIDER_SITE_OTHER): Payer: Self-pay | Admitting: Gastroenterology

## 2023-01-13 ENCOUNTER — Encounter: Payer: Self-pay | Admitting: Gastroenterology

## 2023-01-13 VITALS — BP 142/92 | HR 64 | Ht 60.0 in | Wt 171.0 lb

## 2023-01-13 DIAGNOSIS — R932 Abnormal findings on diagnostic imaging of liver and biliary tract: Secondary | ICD-10-CM | POA: Insufficient documentation

## 2023-01-13 DIAGNOSIS — K760 Fatty (change of) liver, not elsewhere classified: Secondary | ICD-10-CM | POA: Insufficient documentation

## 2023-01-13 DIAGNOSIS — K219 Gastro-esophageal reflux disease without esophagitis: Secondary | ICD-10-CM

## 2023-01-13 MED ORDER — PANTOPRAZOLE SODIUM 40 MG PO TBEC: 40.0000 mg | DELAYED_RELEASE_TABLET | Freq: Two times a day (BID) | ORAL | 3 refills | Status: AC

## 2023-01-13 NOTE — Patient Instructions (Signed)
Se le ha programado una resonancia magntica en [facility] (radiologa del primer piso) el mircoles 02/02/23. La hora de su cita son las 11 am. Llegue a la sala de admisin (en la entrada principal del hospital) 30 minutos antes de la hora de su cita para fines de Engineer, maintenance (IT). Asegrese de no comer ni beber nada 6 horas antes de la prueba. Adems, si tiene algn metal en su cuerpo, tiene un marcapasos o un desfibrilador, asegrese de comunicrselo a su mdico tratante. Esta prueba suele tardar entre 45 minutos y 1 hora en completarse. Si necesita reprogramar, llame al [phone] para hacerlo.  Hemos enviado los siguientes medicamentos a su farmacia para que los recoja cuando le convenga: Pantoprazol 40 mg [employer] al da 30-60 minutos antes del desayuno y Physicist, medical.  _______________________________________________________  Arville Lime presin arterial en su visita era 140/90 o ms, comunquese con su mdico de atencin primaria para Presenter, broadcasting.  _______________________________________________________  Si tiene 65 aos o ms, su ndice de masa corporal debe estar entre 23 y 30. Su ndice de masa corporal es de 33,4 kg/m. Si esto est fuera del rango mencionado anteriormente, considere realizar un seguimiento con su proveedor de Marine scientist.  Si tiene 64 aos o menos, su ndice de [employer] corporal debe estar entre 19 y 24. Su ndice de masa corporal es de 33,4 kg/m. Si esto est fuera del rango mencionado anteriormente, considere realizar un seguimiento con su proveedor de Marine scientist.   ________________________________________________________  [employer] de [facility] desean alentarlo a Chemical engineer MYCHART para comunicarse con los proveedores para solicitudes o preguntas que no sean urgentes.  Debido a los Retail buyer de espera en el telfono, enviar un mensaje a su proveedor a travs de [employer] puede ser una forma ms rpida y eficiente de obtener una respuesta.   Espere 48 horas hbiles para recibir [employer].  Recuerde que esto es para solicitudes no urgentes.  _______________________________________________________   Alexandra Wu have been scheduled for an MRI at [facility] (1st floor radiology) on Wednesday 03/04/23. Your appointment time is 11 am. Please arrive to admitting (at main entrance of the hospital) 30 minutes prior to your appointment time for registration purposes. Please make certain not to have anything to eat or drink 6 hours prior to your test. In addition, if you have any metal in your body, have a pacemaker or defibrillator, please be sure to let your ordering physician know. This test typically takes 45 minutes to 1 hour to complete. Should you need to reschedule, please call [phone] to do so.  We have sent the following medications to your pharmacy for you to pick up at your convenience: Pantoprazole 40 mg twice daily 30-60 minutes before breakfast and dinner.  _______________________________________________________  If your blood pressure at your visit was 140/90 or greater, please contact your primary care physician to follow up on this.  _______________________________________________________  If you are age 12 or older, your body mass index should be between 23-30. Your Body mass index is 33.4 kg/m. If this is out of the aforementioned range listed, please consider follow up with your Primary Care Provider.  If you are age 18 or younger, your body mass index should be between 19-25. Your Body mass index is 33.4 kg/m. If this is out of the aformentioned range listed, please consider follow up with your Primary Care Provider.   ________________________________________________________  The [facility] providers would like to encourage you to use [facility] to communicate with  providers for non-urgent requests or questions.  Due to long hold times on the telephone, sending your provider a message by [facility] may be a faster  and more efficient way to get a response.  Please allow 48 business hours for a response.  Please remember that this is for non-urgent requests.  _______________________________________________________

## 2023-01-13 NOTE — Progress Notes (Signed)
01/13/2023 Alexandra Wu [id number] [birth year]   HISTORY OF PRESENT ILLNESS: This is a 52 year old female who was previously a patient of Dr. Orvan Falconer.  Her care will be assumed by Dr. Barron Alvine.  She is here for follow-up after her endoscopy and colonoscopy with Dr. Orvan Falconer in April.  She had colonoscopy performed for screening and endoscopy performed for complaints of bloating, heartburn/reflux.  Colonoscopy April 2024 was normal.  EGD April 2024 showed some congested mucosa in the esophagus and some gastritis.  1. Surgical [P], duodenum - DUODENAL MUCOSA WITH NO SIGNIFICANT PATHOLOGY. 2. Surgical [P], gastric fundus, gastric antrum, and gastric body - ANTRAL MUCOSA WITH FEATURES OF BOTH MILD CHRONIC INACTIVE GASTRITIS AND CHEMICAL/REACTIVE CHANGE. - OXYNTIC MUCOSA WITH NO SIGNIFICANT PATHOLOGY. - NO HELICOBACTER PYLORI ORGANISMS IDENTIFIED ON H&E STAINED SLIDE. 3. Surgical [P], distal esophagus - SQUAMOUS MUCOSA WITH MILD BASAL CELL HYPERPLASIA SUGGESTIVE OF REFLUX.  Ultrasound showed increased echogenicity most consistent with hepatic steatosis with a geographic region of decreased echogenicity which favored represent an region of focal fatty sparing but underlying mass is considered although less likely.  They recommended MRI abdomen with and without contrast to better evaluate.  She is on pantoprazole 40 mg twice daily for her upper GI findings and overall feeling well from a GI standpoint.  Patient is primarily Spanish-speaking, therefore, an interpreter/transfers present the entirety of the visit.  Past Medical History:  Diagnosis Date   Appendicitis, acute 04/23/2013   Lap appendectomy on 03/23/13   Dyslipidemia 03/24/2017   Hyperglycemia 02/17/2017   Hypertension    Nodular goiter 03/24/2017   Thyromegaly 02/17/2017   Past Surgical History:  Procedure Laterality Date   LAPAROSCOPIC APPENDECTOMY N/A 04/23/2013   Procedure: APPENDECTOMY LAPAROSCOPIC;   Surgeon: Robyne Askew, MD;  Location: [facility] OR;  Service: General;  Laterality: N/A;   TUBAL LIGATION  1997    reports that she has never smoked. She has never used smokeless tobacco. She reports that she does not drink alcohol and does not use drugs. family history includes Arthritis in her mother, sister, and sister; Diabetes in her sister; Headache in her daughter; Heart disease in her brother; Hypertension in her mother. No Known Allergies    Outpatient Encounter Medications as of 01/13/2023  Medication Sig   carvedilol (COREG) 6.25 MG tablet Take 1 tablet (6.25 mg total) by mouth 2 (two) times daily with a meal.   cetirizine (ZYRTEC) 10 MG tablet Take 1 tablet (10 mg total) by mouth daily.   fluticasone (FLONASE) 50 MCG/ACT nasal spray USE 1 SPRAY(S) IN EACH NOSTRIL TWICE DAILY AS NEEDED   Multiple Vitamins-Minerals (MULTIVITAMIN ADULT, MINERALS,) TABS multivitamin  i tab po qd   olopatadine (PATANOL) 0.1 % ophthalmic solution INSTILL 1 DROP INTO AFFECTED EYE(S) BY OPHTHALMIC ROUTE 2 TIMES PER DAY AT AN INTERVAL OF 6 TO 8 HOURS   Omega-3 Fatty Acids (OMEGA-3 FISH OIL PO) Take by mouth daily.   pantoprazole (PROTONIX) 40 MG tablet Take 1 tablet (40 mg total) by mouth daily.   spironolactone (ALDACTONE) 50 MG tablet Take 1 tablet (50 mg total) by mouth daily.   tiZANidine (ZANAFLEX) 4 MG tablet Take 1 tablet (4 mg total) by mouth every 8 (eight) hours as needed for muscle spasms.   valsartan (DIOVAN) 80 MG tablet Take 1 tablet (80 mg total) by mouth daily.   [DISCONTINUED] Cholecalciferol 1.25 MG (50000 UT) capsule cholecalciferol (vitamin D3) 1,250 mcg (50,000 unit) capsule  Take 1 capsule every week  by oral route.   [DISCONTINUED] meloxicam (MOBIC) 15 MG tablet meloxicam 15 mg tablet  TAKE 1 TABLET BY MOUTH ONCE DAILY AS NEEDED FOR PAIN WITH FOOD   No facility-administered encounter medications on file as of 01/13/2023.    REVIEW OF SYSTEMS  : All other systems reviewed and negative  except where noted in the History of Present Illness.   PHYSICAL EXAM: BP (!) 142/92   Pulse 64   Ht 5' (1.524 m)   Wt 171 lb (77.6 kg)   LMP 09/01/2018   BMI 33.40 kg/m  General: Well developed white female in no acute distress Head: Normocephalic and atraumatic Eyes:  Sclerae anicteric, conjunctiva pink. Ears: Normal auditory acuity Musculoskeletal: Symmetrical with no gross deformities  Skin: No lesions on visible extremities Extremities: No edema  Neurological: Alert oriented x 4, grossly non-focal Psychological:  Alert and cooperative. Normal mood and affect  ASSESSMENT AND PLAN: *GERD/gastritis: Feeling well on pantoprazole 40 mg twice daily.  She will continue this.  New prescription sent to pharmacy. *Hepatic steatosis with abnormal ultrasound of the liver question area of focal fatty sparing versus mass: MRI recommended for follow-up.  Will schedule.   CC:  Hoy Register, MD

## 2023-01-16 NOTE — Progress Notes (Signed)
Agree with the assessment and plan as outlined by Jessica Zehr, PA-C. ? ?Ellarose Brandi, DO, FACG ? ?

## 2023-01-26 ENCOUNTER — Ambulatory Visit: Payer: Self-pay | Attending: Family Medicine | Admitting: Family Medicine

## 2023-01-26 ENCOUNTER — Encounter: Payer: Self-pay | Admitting: Family Medicine

## 2023-01-26 VITALS — BP 154/84 | HR 60 | Temp 97.9°F | Ht 60.0 in | Wt 172.6 lb

## 2023-01-26 DIAGNOSIS — H9201 Otalgia, right ear: Secondary | ICD-10-CM

## 2023-01-26 DIAGNOSIS — G4709 Other insomnia: Secondary | ICD-10-CM

## 2023-01-26 DIAGNOSIS — Z13228 Encounter for screening for other metabolic disorders: Secondary | ICD-10-CM

## 2023-01-26 DIAGNOSIS — R7303 Prediabetes: Secondary | ICD-10-CM

## 2023-01-26 DIAGNOSIS — I10 Essential (primary) hypertension: Secondary | ICD-10-CM

## 2023-01-26 MED ORDER — PREDNISONE 20 MG PO TABS
20.0000 mg | ORAL_TABLET | Freq: Every day | ORAL | 0 refills | Status: DC
Start: 2023-01-26 — End: 2023-09-09

## 2023-01-26 MED ORDER — VALSARTAN-HYDROCHLOROTHIAZIDE 80-12.5 MG PO TABS
1.0000 | ORAL_TABLET | Freq: Every day | ORAL | 1 refills | Status: DC
Start: 2023-01-26 — End: 2023-07-15

## 2023-01-26 MED ORDER — HYDROXYZINE HCL 25 MG PO TABS
25.0000 mg | ORAL_TABLET | Freq: Every evening | ORAL | 0 refills | Status: DC | PRN
Start: 2023-01-26 — End: 2023-09-09

## 2023-01-26 NOTE — Progress Notes (Signed)
Subjective:  Patient ID: Alexandra Wu, female    DOB: Oct 23, 1970  Age: 52 y.o. MRN: [id number]  CC: Hypertension   HPI Alexandra Wu is a 52 y.o. year old female with a history of hypertension here for an office visit.   Interval History:  She Complains of right ear pain which has been present for 'a while' with associated throat dryness which has been intermittent. She feels like she has a foreign body in her right ear and she feels like scratching it. Also has rhinorrhea. She has been taking her Zyrtec but not using Flonase.      Also Complains of insomnia and endorses intake of caffeine up to 2pm.She does not take daytime naps. Endorses going for walks.  Blood pressure is elevated and she endorses adherence with Coreg, spironolactone and valsartan.      Past Medical History:  Diagnosis Date   Appendicitis, acute 04/23/2013   Lap appendectomy on 03/23/13   Dyslipidemia 03/24/2017   Hyperglycemia 02/17/2017   Hypertension    Nodular goiter 03/24/2017   Thyromegaly 02/17/2017    Past Surgical History:  Procedure Laterality Date   LAPAROSCOPIC APPENDECTOMY N/A 04/23/2013   Procedure: APPENDECTOMY LAPAROSCOPIC;  Surgeon: Robyne Askew, MD;  Location: [facility] OR;  Service: General;  Laterality: N/A;   TUBAL LIGATION  1997    Family History  Problem Relation Age of Onset   Arthritis Mother    Hypertension Mother    Arthritis Sister    Diabetes Sister    Arthritis Sister    Heart disease Brother    Headache Daughter    Colon cancer Neg Hx    Esophageal cancer Neg Hx    Stomach cancer Neg Hx    Rectal cancer Neg Hx     Social History   Socioeconomic History   Marital status: Married    Spouse name: Personal assistant   Number of children: 3   Years of education: 1   Highest education level: Not on file  Occupational History   Occupation: Pensions consultant for offices   Occupation: not working  Tobacco Use   Smoking status: Never    Smokeless tobacco: Never  Vaping Use   Vaping Use: Never used  Substance and Sexual Activity   Alcohol use: No   Drug use: No   Sexual activity: Yes    Birth control/protection: Surgical  Other Topics Concern   Not on file  Social History Narrative   [place]   [place]   Lives at home with husband   Social Determinants of Health   Financial Resource Strain: Low Risk  (10/29/2022)   Overall Financial Resource Strain (CARDIA)    Difficulty of Paying Living Expenses: Not very hard  Food Insecurity: No Food Insecurity (10/29/2022)   Hunger Vital Sign    Worried About Running Out of Food in the Last Year: Never true    Ran Out of Food in the Last Year: Never true  Transportation Needs: No Transportation Needs (10/29/2022)   PRAPARE - Administrator, Civil Service (Medical): No    Lack of Transportation (Non-Medical): No  Physical Activity: Insufficiently Active (10/29/2022)   Exercise Vital Sign    Days of Exercise per Week: 3 days    Minutes of Exercise per Session: 40 min  Stress: No Stress Concern Present (10/29/2022)   [employer] of Occupational Health - Occupational Stress Questionnaire    Feeling of  Stress : Only a little  Social Connections: Socially Integrated (10/29/2022)   Social Connection and Isolation Panel [NHANES]    Frequency of Communication with Friends and Family: Three times a week    Frequency of Social Gatherings with Friends and Family: Twice a week    Attends Religious Services: More than 4 times per year    Active Member of [employer] or Organizations: Yes    Attends Engineer, structural: More than 4 times per year    Marital Status: Married    No Known Allergies  Outpatient Medications Prior to Visit  Medication Sig Dispense Refill   carvedilol (COREG) 6.25 MG tablet Take 1 tablet (6.25 mg total) by mouth 2 (two) times daily with a meal. 180 tablet 1   cetirizine (ZYRTEC) 10 MG tablet Take 1 tablet (10 mg  total) by mouth daily. 90 tablet 1   fluticasone (FLONASE) 50 MCG/ACT nasal spray USE 1 SPRAY(S) IN EACH NOSTRIL TWICE DAILY AS NEEDED     Multiple Vitamins-Minerals (MULTIVITAMIN ADULT, MINERALS,) TABS multivitamin  i tab po qd     olopatadine (PATANOL) 0.1 % ophthalmic solution INSTILL 1 DROP INTO AFFECTED EYE(S) BY OPHTHALMIC ROUTE 2 TIMES PER DAY AT AN INTERVAL OF 6 TO 8 HOURS     Omega-3 Fatty Acids (OMEGA-3 FISH OIL PO) Take by mouth daily.     pantoprazole (PROTONIX) 40 MG tablet Take 1 tablet (40 mg total) by mouth 2 (two) times daily. 180 tablet 3   spironolactone (ALDACTONE) 50 MG tablet Take 1 tablet (50 mg total) by mouth daily. 90 tablet 1   tiZANidine (ZANAFLEX) 4 MG tablet Take 1 tablet (4 mg total) by mouth every 8 (eight) hours as needed for muscle spasms. 90 tablet 1   valsartan (DIOVAN) 80 MG tablet Take 1 tablet (80 mg total) by mouth daily. 90 tablet 1   No facility-administered medications prior to visit.     ROS Review of Systems  Constitutional:  Negative for activity change and appetite change.  HENT:  Positive for ear pain. Negative for sinus pressure and sore throat.   Respiratory:  Negative for chest tightness, shortness of breath and wheezing.   Cardiovascular:  Negative for chest pain and palpitations.  Gastrointestinal:  Negative for abdominal distention, abdominal pain and constipation.  Genitourinary: Negative.   Musculoskeletal: Negative.   Psychiatric/Behavioral:  Positive for sleep disturbance. Negative for behavioral problems and dysphoric mood.     Objective:  BP (!) 154/84   Pulse 60   Temp 97.9 F (36.6 C) (Oral)   Ht 5' (1.524 m)   Wt 172 lb 9.6 oz (78.3 kg)   LMP 09/01/2018   SpO2 99%   BMI 33.71 kg/m      01/26/2023    5:07 PM 01/26/2023    4:28 PM 01/13/2023    9:49 AM  BP/Weight  Systolic BP 154 159 142  Diastolic BP 84 88 92  Wt. (Lbs)  172.6 171  BMI  33.71 kg/m2 33.4 kg/m2      Physical Exam Constitutional:       Appearance: She is well-developed.  Cardiovascular:     Rate and Rhythm: Normal rate.     Heart sounds: Normal heart sounds. No murmur heard. Pulmonary:     Effort: Pulmonary effort is normal.     Breath sounds: Normal breath sounds. No wheezing or rales.  Chest:     Chest wall: No tenderness.  Abdominal:     General: Bowel sounds are normal.  There is no distension.     Palpations: Abdomen is soft. There is no mass.     Tenderness: There is no abdominal tenderness.  Musculoskeletal:        General: Normal range of motion.     Right lower leg: No edema.     Left lower leg: No edema.  Neurological:     Mental Status: She is alert and oriented to person, place, and time.  Psychiatric:        Mood and Affect: Mood normal.        Latest Ref Rng & Units 05/06/2022    9:33 AM 09/21/2019    6:04 PM 04/01/2018    9:17 AM  CMP  Glucose 70 - 99 mg/dL 093  235  573   BUN 6 - 24 mg/dL 16  8  11    Creatinine 0.57 - 1.00 mg/dL 2.20  2.54  2.70   Sodium 134 - 144 mmol/L 141  138  140   Potassium 3.5 - 5.2 mmol/L 4.4  2.9  3.7   Chloride 96 - 106 mmol/L 101  104  104   CO2 20 - 29 mmol/L 24  24  22    Calcium 8.7 - 10.2 mg/dL 62.3  9.1  8.8   Total Protein 6.0 - 8.5 g/dL 7.9   6.9   Total Bilirubin 0.0 - 1.2 mg/dL 0.3   0.3   Alkaline Phos 44 - 121 IU/L 115   72   AST 0 - 40 IU/L 23   19   ALT 0 - 32 IU/L 18   14     Lipid Panel     Component Value Date/Time   CHOL 223 (H) 05/06/2022 0933   TRIG 185 (H) 05/06/2022 0933   HDL 50 05/06/2022 0933   CHOLHDL 4.1 Ratio 06/22/2007 0000   VLDL 38 06/22/2007 0000   LDLCALC 140 (H) 05/06/2022 0933    CBC    Component Value Date/Time   WBC 8.2 05/06/2022 0933   WBC 11.9 (H) 09/21/2019 1804   RBC 5.05 05/06/2022 0933   RBC 4.89 09/21/2019 1804   HGB 14.5 05/06/2022 0933   HCT 43.2 05/06/2022 0933   PLT 289 05/06/2022 0933   MCV 86 05/06/2022 0933   MCH 28.7 05/06/2022 0933   MCH 28.6 09/21/2019 1804   MCHC 33.6 05/06/2022 0933    MCHC 32.5 09/21/2019 1804   RDW 13.4 05/06/2022 0933   LYMPHSABS 2.2 05/06/2022 0933   MONOABS 0.7 04/22/2013 1919   EOSABS 0.1 05/06/2022 0933   BASOSABS 0.0 05/06/2022 0933    Lab Results  Component Value Date   HGBA1C 6.3 (H) 05/06/2022    Assessment & Plan:  1. Essential hypertension Uncontrolled Substituted valsartan with valsartan/HCTZ Continue Coreg and spironolactone Counseled on blood pressure goal of less than 130/80, low-sodium, DASH diet, medication compliance, 150 minutes of moderate intensity exercise per week. Discussed medication compliance, adverse effects. - LP+Non-HDL Cholesterol; Future - CMP14+EGFR; Future - CBC with Differential/Platelet; Future - valsartan-hydrochlorothiazide (DIOVAN-HCT) 80-12.5 MG tablet; Take 1 tablet by mouth daily.  Dispense: 90 tablet; Refill: 1  2. Other insomnia Counseled on sleep hygiene Discontinue caffeine intake late in the day - hydrOXYzine (ATARAX) 25 MG tablet; Take 1 tablet (25 mg total) by mouth at bedtime as needed.  Dispense: 30 tablet; Refill: 0  3. Right ear pain Ear exam is normal Will place on short course of prednisone Continue Zyrtec and Flonase - predniSONE (DELTASONE) 20 MG tablet; Take 1  tablet (20 mg total) by mouth daily with breakfast.  Dispense: 5 tablet; Refill: 0  4. Screening for metabolic disorder She is requesting thyroid screening - T4, free; Future - TSH; Future - T3; Future  5. Prediabetes Labs reveal prediabetes with an A1c of 6.3.  Working on a low carbohydrate diet, exercise, weight loss is recommended in order to prevent progression to type 2 diabetes mellitus.  - Hemoglobin A1c; Future                 Meds ordered this encounter  Medications   hydrOXYzine (ATARAX) 25 MG tablet    Sig: Take 1 tablet (25 mg total) by mouth at bedtime as needed.    Dispense:  30 tablet    Refill:  0   predniSONE (DELTASONE) 20 MG tablet    Sig: Take 1 tablet (20 mg total) by mouth daily  with breakfast.    Dispense:  5 tablet    Refill:  0   valsartan-hydrochlorothiazide (DIOVAN-HCT) 80-12.5 MG tablet    Sig: Take 1 tablet by mouth daily.    Dispense:  90 tablet    Refill:  1    Discontinue Diovan    Follow-up: Return in about 1 month (around 02/26/2023) for Blood Pressure follow-up.       Hoy Register, MD, FAAFP. [facility] [place], [place] [phone]   01/26/2023, 5:12 PM

## 2023-01-26 NOTE — Progress Notes (Signed)
Not sleeping Dental/right ear pain

## 2023-01-26 NOTE — Patient Instructions (Signed)
Insomnio Insomnia El insomnio es un trastorno del sueo que causa dificultades para conciliar el sueo o para mantenerlo. Puede producir fatiga, falta de energa, dificultad para concentrarse, cambios en el estado de nimo y mal rendimiento escolar o laboral. Hay tres formas diferentes de clasificar el insomnio: Dificultad para conciliar el sueo. Dificultad para mantener el sueo. Despertar muy precoz por la maana. Cualquier tipo de insomnio puede ser a largo plazo (crnico) o a corto plazo (agudo). Ambos son frecuentes. Generalmente, el insomnio a corto plazo dura 3 meses o menos tiempo. El insomnio crnico ocurre al menos tres veces por semana durante ms de 3 meses. Cules son las causas? El insomnio puede deberse a otra afeccin, situacin o sustancia, por ejemplo: Tener ciertas afecciones de salud mental, como ansiedad y depresin. Consumir cafena, alcohol, tabaco o drogas. Tener afecciones gastrointestinales, como enfermedad de reflujo gastroesofgico (ERGE). Tener ciertas afecciones. Estas incluyen las siguientes: Asma. Enfermedad de Alzheimer. Accidente cerebrovascular. Dolor crnico. Hiperactividad de la glndula tiroidea (hipertiroidismo). Otros trastornos del sueo, como sndrome de las piernas inquietas y apnea del sueo. Menopausia. En ocasiones, la causa del insomnio puede ser desconocida. Qu incrementa el riesgo? Los factores de riesgo de tener insomnio incluyen lo siguiente: Sexo. Esta afeccin es ms frecuente en las mujeres que en los hombres. Edad. El insomnio es ms frecuente a medida que las personas envejecen. El estrs y ciertas afecciones de salud mental y mdicas. Falta de actividad fsica. Tener un horario de trabajo irregular. Esto puede incluir trabajar turnos nocturnos y viajar entre diferentes zonas horarias. Cules son los signos o sntomas? Si tiene insomnio, el sntoma principal es la dificultad para conciliar el sueo o mantenerlo. Esto puede  derivar en otros sntomas, por ejemplo: Sentirse cansado o tener poca energa. Ponerse nervioso por tener que irse a dormir. No sentirse descansado por la maana. Tener dificultad para concentrarse. Sentirse irritable, ansioso o deprimido. Cmo se diagnostica? Esta afeccin se puede diagnosticar en funcin de lo siguiente: Los sntomas y antecedentes mdicos. El mdico puede hacerle preguntas sobre: Hbitos de sueo. Cualquier afeccin mdica que tenga. La salud mental. Un examen fsico. Cmo se trata? El tratamiento para el insomnio depende de la causa. El tratamiento puede centrarse en tratar una afeccin subyacente que causa el insomnio. El tratamiento tambin puede incluir lo siguiente: Medicamentos que lo ayuden a dormir. Asesoramiento psicolgico o terapia. Ajustes en el estilo de vida para ayudarlo a dormir mejor. Siga estas indicaciones en su casa: Comida y bebida  Limite o evite el consumo de alcohol, bebidas con cafena y productos que contengan nicotina y tabaco, especialmente cerca de la hora de acostarse. Estos productos pueden perturbarle el sueo. No consuma una comida suculenta ni coma alimentos condimentados justo antes de la hora de acostarse. Esto puede causarle molestias digestivas y dificultades para dormir. Hbitos de sueo  Lleve un registro del sueo ya que podra ser de utilidad para que usted y a su mdico puedan determinar qu podra estar causndole insomnio. Escriba los siguientes datos: Cundo duerme. Cundo se despierta durante la noche. Qu tan bien duerme y si se siente descansado al da siguiente. Cualquier efecto secundario de los medicamentos que toma. Lo que usted come y bebe. Convierta su habitacin en un lugar oscuro, cmodo donde sea fcil conciliar el sueo. Coloque persianas o cortinas oscuras que impidan la entrada de la luz del exterior. Para bloquear los ruidos, use un aparato que reproduzca sonidos ambientales o relajantes de  fondo. Mantenga baja la temperatura. Limite el uso de   pantallas antes de la hora de [place]. Esto incluye: No ver televisin. No usar el telfono inteligente, la tableta o la computadora. Siga una rutina que incluya ir a dormir y Chiropodist a la misma hora cada da y noche. Esto puede ayudarlo a conciliar el sueo ms rpidamente. Considere realizar [employer] tranquila, como leer, e incorporarla como parte de la rutina a la hora de irse a dormir. Trate de evitar tomar siestas durante el da para que pueda dormir mejor por la noche. Levntese de la cama si sigue despierto despus de 15 minutos de haber intentado dormirse. Mantenga bajas las luces, pero intente leer o hacer una actividad tranquila. Cuando tenga sueo, regrese a Pharmacist, hospital. Indicaciones generales Use los medicamentos de venta libre y los recetados solamente como se lo haya indicado el mdico. Haga actividad fsica habitualmente como se lo haya indicado el mdico. Sin embargo, evite hacer actividad fsica las horas antes de [place]. Utilice tcnicas de relajacin para controlar el estrs. Pdale al mdico que le sugiera algunas tcnicas que sean adecuadas para usted. Pueden incluir: Ejercicios de respiracin. Rutinas para aliviar la tensin muscular. Visualizacin de escenas apacibles. Conduzca con cuidado. No conduzca si se siente muy somnoliento. Concurra a todas las visitas de seguimiento. Esto es importante. Comunquese con un mdico si: Est cansado durante todo Medical laboratory scientific officer. Tiene dificultad en su rutina diaria debido a la somnolencia. Sigue teniendo problemas para dormir o Teacher, English as a foreign language. Solicite ayuda de inmediato si: Piensa en lastimarse a usted mismo o a Engineer, maintenance (IT). Busque ayuda de inmediato si alguna vez siente que puede hacerse dao a usted mismo o a otros, o tiene pensamientos de Patent examiner a su vida. Dirjase al centro de urgencias ms cercano o: Llame al 911. Llame a National Suicide Prevention Lifeline (Lnea  Telefnica Nacional para la Prevencin del Suicidio) al [phone] o al 988. Est disponible las 24 horas del da. Enve un mensaje de texto a la lnea para casos de crisis al [phone]. Resumen El insomnio es un trastorno del sueo que causa dificultades para conciliar el sueo o para [place]. El insomnio puede ser a Air cabin crew (crnico) o a Product manager (agudo). El tratamiento para el insomnio depende de la causa. El tratamiento puede centrarse en tratar una afeccin subyacente que causa el insomnio. Lleve un registro del sueo ya que podra ser de utilidad para que usted y a su mdico puedan determinar qu podra estar causndole insomnio. Esta informacin no tiene Theme park manager el consejo del mdico. Asegrese de hacerle al mdico cualquier pregunta que tenga. Document Revised: 07/25/2021 Document Reviewed: 07/25/2021 Elsevier Patient Education  2024 [employer].

## 2023-01-30 ENCOUNTER — Ambulatory Visit: Payer: Self-pay | Attending: Family Medicine

## 2023-01-30 DIAGNOSIS — I10 Essential (primary) hypertension: Secondary | ICD-10-CM

## 2023-01-30 DIAGNOSIS — Z13228 Encounter for screening for other metabolic disorders: Secondary | ICD-10-CM

## 2023-01-30 DIAGNOSIS — R7303 Prediabetes: Secondary | ICD-10-CM

## 2023-01-31 LAB — CMP14+EGFR
ALT: 14 IU/L (ref 0–32)
AST: 17 IU/L (ref 0–40)
Albumin: 4.2 g/dL (ref 3.8–4.9)
Alkaline Phosphatase: 82 IU/L (ref 44–121)
BUN/Creatinine Ratio: 20 (ref 9–23)
BUN: 14 mg/dL (ref 6–24)
Bilirubin Total: 0.2 mg/dL (ref 0.0–1.2)
CO2: 22 mmol/L (ref 20–29)
Calcium: 9 mg/dL (ref 8.7–10.2)
Chloride: 105 mmol/L (ref 96–106)
Creatinine, Ser: 0.7 mg/dL (ref 0.57–1.00)
Globulin, Total: 2.7 g/dL (ref 1.5–4.5)
Glucose: 103 mg/dL — ABNORMAL HIGH (ref 70–99)
Potassium: 4.3 mmol/L (ref 3.5–5.2)
Sodium: 140 mmol/L (ref 134–144)
Total Protein: 6.9 g/dL (ref 6.0–8.5)
eGFR: 103 mL/min/{1.73_m2} (ref >59)

## 2023-01-31 LAB — T3: T3, Total: 96 ng/dL (ref 71–180)

## 2023-01-31 LAB — HEMOGLOBIN A1C
Est. average glucose Bld gHb Est-mCnc: 140 mg/dL
Hgb A1c MFr Bld: 6.5 % — ABNORMAL HIGH (ref 4.8–5.6)

## 2023-01-31 LAB — T4, FREE: Free T4: 1.22 ng/dL (ref 0.82–1.77)

## 2023-01-31 LAB — CBC WITH DIFFERENTIAL/PLATELET
Basophils Absolute: 0.1 10*3/uL (ref 0.0–0.2)
Basos: 1 %
EOS (ABSOLUTE): 0.1 10*3/uL (ref 0.0–0.4)
Eos: 1 %
Hematocrit: 38.7 % (ref 34.0–46.6)
Hemoglobin: 12.4 g/dL (ref 11.1–15.9)
Immature Grans (Abs): 0 10*3/uL (ref 0.0–0.1)
Immature Granulocytes: 0 %
Lymphocytes Absolute: 3.8 10*3/uL — ABNORMAL HIGH (ref 0.7–3.1)
Lymphs: 39 %
MCH: 28.2 pg (ref 26.6–33.0)
MCHC: 32 g/dL (ref 31.5–35.7)
MCV: 88 fL (ref 79–97)
Monocytes Absolute: 0.6 10*3/uL (ref 0.1–0.9)
Monocytes: 7 %
Neutrophils Absolute: 5.2 10*3/uL (ref 1.4–7.0)
Neutrophils: 52 %
Platelets: 259 10*3/uL (ref 150–450)
RBC: 4.4 x10E6/uL (ref 3.77–5.28)
RDW: 13.9 % (ref 11.7–15.4)
WBC: 9.7 10*3/uL (ref 3.4–10.8)

## 2023-01-31 LAB — LP+NON-HDL CHOLESTEROL
Cholesterol, Total: 193 mg/dL (ref 100–199)
HDL: 47 mg/dL (ref >39)
LDL Chol Calc (NIH): 119 mg/dL — ABNORMAL HIGH (ref 0–99)
Total Non-HDL-Chol (LDL+VLDL): 146 mg/dL — ABNORMAL HIGH (ref 0–129)
Triglycerides: 150 mg/dL — ABNORMAL HIGH (ref 0–149)
VLDL Cholesterol Cal: 27 mg/dL (ref 5–40)

## 2023-01-31 LAB — TSH: TSH: 1.69 u[IU]/mL (ref 0.450–4.500)

## 2023-02-03 ENCOUNTER — Encounter: Payer: Self-pay | Admitting: Family Medicine

## 2023-02-03 ENCOUNTER — Other Ambulatory Visit: Payer: Self-pay | Admitting: Family Medicine

## 2023-02-03 DIAGNOSIS — E119 Type 2 diabetes mellitus without complications: Secondary | ICD-10-CM | POA: Insufficient documentation

## 2023-02-03 MED ORDER — ATORVASTATIN CALCIUM 20 MG PO TABS: 20.0000 mg | ORAL_TABLET | Freq: Every day | ORAL | 1 refills | Status: DC

## 2023-02-03 MED ORDER — METFORMIN HCL 500 MG PO TABS
500.0000 mg | ORAL_TABLET | Freq: Every day | ORAL | 1 refills | Status: DC
Start: 2023-02-03 — End: 2023-11-19

## 2023-03-04 ENCOUNTER — Ambulatory Visit (HOSPITAL_COMMUNITY)
Admission: RE | Admit: 2023-03-04 | Discharge: 2023-03-04 | Disposition: A | Discharge status: Home / Self Care | Payer: Self-pay | Source: Ambulatory Visit | Attending: Gastroenterology | Admitting: Gastroenterology

## 2023-03-04 DIAGNOSIS — R932 Abnormal findings on diagnostic imaging of liver and biliary tract: Secondary | ICD-10-CM | POA: Insufficient documentation

## 2023-03-04 MED ORDER — GADOBUTROL 1 MMOL/ML IV SOLN
7.0000 mL | Freq: Once | INTRAVENOUS | Status: AC | PRN
  Administered 2023-03-04: 7 mL via INTRAVENOUS

## 2023-03-09 ENCOUNTER — Ambulatory Visit: Payer: Self-pay | Attending: Family Medicine | Admitting: Family Medicine

## 2023-03-09 ENCOUNTER — Encounter: Payer: Self-pay | Admitting: Family Medicine

## 2023-03-09 VITALS — BP 134/81 | HR 60 | Ht 60.0 in | Wt 167.6 lb

## 2023-03-09 DIAGNOSIS — Z7984 Long term (current) use of oral hypoglycemic drugs: Secondary | ICD-10-CM

## 2023-03-09 DIAGNOSIS — E1169 Type 2 diabetes mellitus with other specified complication: Secondary | ICD-10-CM

## 2023-03-09 DIAGNOSIS — G8929 Other chronic pain: Secondary | ICD-10-CM

## 2023-03-09 DIAGNOSIS — I10 Essential (primary) hypertension: Secondary | ICD-10-CM

## 2023-03-09 DIAGNOSIS — K089 Disorder of teeth and supporting structures, unspecified: Secondary | ICD-10-CM

## 2023-03-09 MED ORDER — CARVEDILOL 6.25 MG PO TABS
6.2500 mg | ORAL_TABLET | Freq: Two times a day (BID) | ORAL | 1 refills | Status: DC
Start: 2023-03-09 — End: 2023-09-09

## 2023-03-09 MED ORDER — TRUEPLUS LANCETS 28G MISC: 1.0000 | Freq: Three times a day (TID) | 12 refills | Status: AC

## 2023-03-09 MED ORDER — TRUE METRIX BLOOD GLUCOSE TEST VI STRP: ORAL_STRIP | 12 refills | Status: AC

## 2023-03-09 MED ORDER — SPIRONOLACTONE 50 MG PO TABS
50.0000 mg | ORAL_TABLET | Freq: Every day | ORAL | 1 refills | Status: DC
Start: 2023-03-09 — End: 2023-09-09

## 2023-03-09 MED ORDER — TRUE METRIX METER DEVI: 1.0000 | Freq: Three times a day (TID) | 0 refills | Status: AC

## 2023-03-09 MED ORDER — AMOXICILLIN-POT CLAVULANATE 875-125 MG PO TABS: 1.0000 | ORAL_TABLET | Freq: Two times a day (BID) | ORAL | 0 refills | Status: DC

## 2023-03-09 NOTE — Patient Instructions (Signed)
Diabetes mellitus y nutricin, en adultos Diabetes Mellitus and Nutrition, Adult Si sufre de diabetes, o diabetes mellitus, es muy importante tener hbitos alimenticios saludables debido a que sus niveles de azcar en la sangre (glucosa) se ven afectados en gran medida por lo que come y bebe. Comer alimentos saludables en las cantidades correctas, aproximadamente a la misma hora todos los das, lo ayudar a: Controlar su glucemia. Disminuir el riesgo de sufrir una enfermedad cardaca. Mejorar la presin arterial. Alcanzar o mantener un peso saludable. Qu puede afectar mi plan de alimentacin? Todas las personas que sufren de diabetes son diferentes y cada una tiene necesidades diferentes en cuanto a un plan de alimentacin. El mdico puede recomendarle que trabaje con un nutricionista para elaborar el mejor plan para usted. Su plan de alimentacin puede variar segn factores como: Las caloras que necesita. Los medicamentos que toma. Su peso. Sus niveles de glucemia, presin arterial y colesterol. Su nivel de actividad. Otras afecciones que tenga, como enfermedades cardacas o renales. Cmo me afectan los carbohidratos? Los carbohidratos, o hidratos de carbono, afectan su nivel de glucemia ms que cualquier otro tipo de alimento. La ingesta de carbohidratos aumenta la cantidad de glucosa en la sangre. Es importante conocer la cantidad de carbohidratos que se pueden ingerir en cada comida sin correr ningn riesgo. Esto es diferente en cada persona. Su nutricionista puede ayudarlo a calcular la cantidad de carbohidratos que debe ingerir en cada comida y en cada refrigerio. Cmo me afecta el alcohol? El alcohol puede provocar una disminucin de la glucemia (hipoglucemia), especialmente si usa insulina o toma determinados medicamentos por va oral para la diabetes. La hipoglucemia es una afeccin potencialmente mortal. Los sntomas de la hipoglucemia, como somnolencia, mareos y confusin, son  similares a los sntomas de haber consumido demasiado alcohol. No beba alcohol si: Su mdico le indica no hacerlo. Est embarazada, puede estar embarazada o est tratando de quedar embarazada. Si bebe alcohol: Limite la cantidad que bebe a lo siguiente: De 0 a 1 medida por da para las mujeres. De 0 a 2 medidas por da para los hombres. Sepa cunta cantidad de alcohol hay en las bebidas que toma. En los [place], una medida equivale a una botella de cerveza de 12 oz (355 ml), un vaso de vino de 5 oz (148 ml) o un vaso de una bebida alcohlica de alta graduacin de 1 oz (44 ml). Mantngase hidratado bebiendo agua, refrescos dietticos o t helado sin azcar. Tenga en cuenta que los refrescos comunes, los jugos y otras bebidas para mezclar pueden contener mucha azcar y se deben contar como carbohidratos. Consejos para seguir este plan  Leer las etiquetas de los alimentos Comience por leer el tamao de la porcin en la etiqueta de Informacin nutricional de los alimentos envasados y las bebidas. La cantidad de caloras, carbohidratos, grasas y otros nutrientes detallados en la etiqueta se basan en una porcin del alimento. Muchos alimentos contienen ms de una porcin por envase. Verifique la cantidad total de gramos (g) de carbohidratos totales en una porcin. Verifique la cantidad de gramos de grasas saturadas y grasas trans en una porcin. Escoja alimentos que no contengan estas grasas o que su contenido de estas sea bajo. Verifique la cantidad de miligramos (mg) de sal (sodio) en una porcin. La mayora de las personas deben limitar la ingesta de sodio total a menos de 2300 mg por da. Siempre consulte la informacin nutricional de los alimentos etiquetados como "con bajo contenido de grasa" o "sin grasa".   Estos alimentos pueden tener un mayor contenido de azcar agregada o carbohidratos refinados, y deben evitarse. Hable con su nutricionista para identificar sus objetivos diarios en  cuanto a los nutrientes mencionados en la etiqueta. Al ir de compras Evite comprar alimentos procesados, enlatados o precocidos. Estos alimentos tienden a tener una mayor cantidad de grasa, sodio y azcar agregada. Compre en la zona exterior de la tienda de comestibles. Esta es la zona donde se encuentran con mayor frecuencia las frutas y las verduras frescas, los cereales a granel, las carnes frescas y los productos lcteos frescos. Al cocinar Use mtodos de coccin a baja temperatura, como hornear, en lugar de mtodos de coccin a alta temperatura, como frer en abundante aceite. Cocine con aceites saludables, como el aceite de oliva, canola o girasol. Evite cocinar con manteca, crema o carnes con alto contenido de grasa. Planificacin de las comidas Coma las comidas y los refrigerios regularmente, preferentemente a la misma hora todos los das. Evite pasar largos perodos de tiempo sin comer. Consuma alimentos ricos en fibra, como frutas frescas, verduras, frijoles y cereales integrales. Consuma entre 4 y 6 onzas (entre 112 y 168 g) de protenas magras por da, como carnes magras, pollo, pescado, huevos o tofu. Una onza (oz) (28 g) de protena magra equivale a: 1 onza (28 g) de carne, pollo o pescado. 1 huevo.  taza (62 g) de tofu. Coma algunos alimentos por da que contengan grasas saludables, como aguacates, frutos secos, semillas y pescado. Qu alimentos debo comer? Frutas Bayas. Manzanas. Naranjas. Duraznos. Damascos. Ciruelas. Uvas. Mangos. Papayas. Granadas. Kiwi. Cerezas. Verduras Verduras de hoja verde, que incluyen lechuga, espinaca, col rizada, acelga, hojas de berza, hojas de mostaza y repollo. Remolachas. Coliflor. Brcoli. Zanahorias. Judas verdes. Tomates. Pimientos. Cebollas. Pepinos. Coles de Bruselas. Granos Granos integrales, como panes, galletas, tortillas, cereales y pastas de salvado o integrales. Avena sin azcar. Quinua. Arroz integral o salvaje. Carnes y otras  protenas Frutos de mar. Carne de ave sin piel. Cortes magros de ave y carne de res. Tofu. Frutos secos. Semillas. Lcteos Productos lcteos sin grasa o con bajo contenido de grasa, como leche, yogur y queso. Es posible que los productos detallados arriba no constituyan una lista completa de los alimentos y las bebidas que puede tomar. Consulte a un nutricionista para obtener ms informacin. Qu alimentos debo evitar? Frutas Frutas enlatadas al almbar. Verduras Verduras enlatadas. Verduras congeladas con mantequilla o salsa de crema. Granos Productos elaborados con harina y harina blanca refinada, como panes, pastas, bocadillos y cereales. Evite todos los alimentos procesados. Carnes y otras protenas Cortes de carne con alto contenido de grasa. Carne de ave con piel. Carnes empanizadas o fritas. Carne procesada. Evite las grasas saturadas. Lcteos Yogur, queso o leche enteros. Bebidas Bebidas azucaradas, como gaseosas o t helado. Es posible que los productos que se enumeran ms arriba no constituyan una lista completa de los alimentos y las bebidas que debe evitar. Consulte a un nutricionista para obtener ms informacin. Preguntas para hacerle al mdico Debo consultar con un especialista certificado en atencin y educacin sobre la diabetes? Es necesario que me rena con un nutricionista? A qu nmero puedo llamar si tengo preguntas? Cules son los mejores momentos para controlar la glucemia? Dnde encontrar ms informacin: American Diabetes Association (Asociacin Estadounidense de la Diabetes): diabetes.org Academy of Nutrition and Dietetics (Academia de Nutricin y Diettica): eatright.org National Institute of Diabetes and Digestive and Kidney Diseases (Instituto Nacional de la Diabetes y las Enfermedades Digestivas y Renales): niddk.nih.gov Association of Diabetes   Care & Education Specialists (Asociacin de Especialistas en Atencin y Educacin sobre la Diabetes):  diabeteseducator.org Resumen Es importante tener hbitos alimenticios saludables debido a que sus niveles de azcar en la sangre (glucosa) se ven afectados en gran medida por lo que come y bebe. Es importante consumir alcohol con prudencia. Un plan de comidas saludable lo ayudar a controlar la glucosa en sangre y a reducir el riesgo de enfermedades cardacas. El mdico puede recomendarle que trabaje con un nutricionista para elaborar el mejor plan para usted. Esta informacin no tiene como fin reemplazar el consejo del mdico. Asegrese de hacerle al mdico cualquier pregunta que tenga. Document Revised: 03/21/2020 Document Reviewed: 03/21/2020 Elsevier Patient Education  2024 Elsevier Inc.  

## 2023-03-09 NOTE — Progress Notes (Signed)
Subjective:  Patient ID: Alexandra Wu, female    DOB: 10/06/1970  Age: 52 y.o. MRN: [id number]  CC: Medical Management of Chronic Issues   HPI Alexandra Wu is a 52 y.o. year old female with a history of  hypertension, 2 diabetes mellitus here for an office visit.   Interval History: Discussed the use of AI scribe software for clinical note transcription with the patient, who gave verbal consent to proceed.  She presents with dental pain in a molar tooth, which has been ongoing for four months. The pain, rated as a seven or eight on a scale of one to ten, is intermittent. She also reports gum bleeding when brushing her teeth. She has not seen a dentist recently.  In addition, the patient was recently diagnosed with type 2 diabetes after a blood test showed elevated sugar levels with an A1c of 6.5. She has been prescribed metformin and has been advised to reduce carbohydrate intake, exercise, and lose weight. She reports walking a lot as part of her exercise regimen.        Past Medical History:  Diagnosis Date   Appendicitis, acute 04/23/2013   Lap appendectomy on 03/23/13   Dyslipidemia 03/24/2017   Hyperglycemia 02/17/2017   Hypertension    Nodular goiter 03/24/2017   Thyromegaly 02/17/2017    Past Surgical History:  Procedure Laterality Date   LAPAROSCOPIC APPENDECTOMY N/A 04/23/2013   Procedure: APPENDECTOMY LAPAROSCOPIC;  Surgeon: Robyne Askew, MD;  Location: [facility] OR;  Service: General;  Laterality: N/A;   TUBAL LIGATION  1997    Family History  Problem Relation Age of Onset   Arthritis Mother    Hypertension Mother    Arthritis Sister    Diabetes Sister    Arthritis Sister    Heart disease Brother    Headache Daughter    Colon cancer Neg Hx    Esophageal cancer Neg Hx    Stomach cancer Neg Hx    Rectal cancer Neg Hx     Social History   Socioeconomic History   Marital status: Married    Spouse name: Personal assistant   Number  of children: 3   Years of education: 1   Highest education level: Not on file  Occupational History   Occupation: Pensions consultant for offices   Occupation: not working  Tobacco Use   Smoking status: Never   Smokeless tobacco: Never  Vaping Use   Vaping status: Never Used  Substance and Sexual Activity   Alcohol use: No   Drug use: No   Sexual activity: Yes    Birth control/protection: Surgical  Other Topics Concern   Not on file  Social History Narrative   Originally [place]   [place]ame [place]   Lives at home with husband   Social Determinants of Health   Financial Resource Strain: Low Risk  (10/29/2022)   Overall Financial Resource Strain (CARDIA)    Difficulty of Paying Living Expenses: Not very hard  Food Insecurity: No Food Insecurity (10/29/2022)   Hunger Vital Sign    Worried About Running Out of Food in the Last Year: Never true    Ran Out of Food in the Last Year: Never true  Transportation Needs: No Transportation Needs (10/29/2022)   PRAPARE - Administrator, Civil Service (Medical): No    Lack of Transportation (Non-Medical): No  Physical Activity: Insufficiently Active (10/29/2022)   Exercise Vital Sign  Days of Exercise per Week: 3 days    Minutes of Exercise per Session: 40 min  Stress: No Stress Concern Present (10/29/2022)   [employer] of Occupational Health - Occupational Stress Questionnaire    Feeling of Stress : Only a little  Social Connections: Socially Integrated (10/29/2022)   Social Connection and Isolation Panel [NHANES]    Frequency of Communication with Friends and Family: Three times a week    Frequency of Social Gatherings with Friends and Family: Twice a week    Attends Religious Services: More than 4 times per year    Active Member of [employer] or Organizations: Yes    Attends Engineer, structural: More than 4 times per year    Marital Status: Married    No Known Allergies  Outpatient Medications Prior  to Visit  Medication Sig Dispense Refill   atorvastatin (LIPITOR) 20 MG tablet Take 1 tablet (20 mg total) by mouth daily. 90 tablet 1   cetirizine (ZYRTEC) 10 MG tablet Take 1 tablet (10 mg total) by mouth daily. 90 tablet 1   fluticasone (FLONASE) 50 MCG/ACT nasal spray USE 1 SPRAY(S) IN EACH NOSTRIL TWICE DAILY AS NEEDED     hydrOXYzine (ATARAX) 25 MG tablet Take 1 tablet (25 mg total) by mouth at bedtime as needed. 30 tablet 0   metFORMIN (GLUCOPHAGE) 500 MG tablet Take 1 tablet (500 mg total) by mouth daily with breakfast. 90 tablet 1   Multiple Vitamins-Minerals (MULTIVITAMIN ADULT, MINERALS,) TABS multivitamin  i tab po qd     olopatadine (PATANOL) 0.1 % ophthalmic solution INSTILL 1 DROP INTO AFFECTED EYE(S) BY OPHTHALMIC ROUTE 2 TIMES PER DAY AT AN INTERVAL OF 6 TO 8 HOURS     Omega-3 Fatty Acids (OMEGA-3 FISH OIL PO) Take by mouth daily.     pantoprazole (PROTONIX) 40 MG tablet Take 1 tablet (40 mg total) by mouth 2 (two) times daily. 180 tablet 3   tiZANidine (ZANAFLEX) 4 MG tablet Take 1 tablet (4 mg total) by mouth every 8 (eight) hours as needed for muscle spasms. 90 tablet 1   valsartan-hydrochlorothiazide (DIOVAN-HCT) 80-12.5 MG tablet Take 1 tablet by mouth daily. 90 tablet 1   carvedilol (COREG) 6.25 MG tablet Take 1 tablet (6.25 mg total) by mouth 2 (two) times daily with a meal. 180 tablet 1   spironolactone (ALDACTONE) 50 MG tablet Take 1 tablet (50 mg total) by mouth daily. 90 tablet 1   predniSONE (DELTASONE) 20 MG tablet Take 1 tablet (20 mg total) by mouth daily with breakfast. (Patient not taking: Reported on 03/09/2023) 5 tablet 0   No facility-administered medications prior to visit.     ROS Review of Systems  Constitutional:  Negative for activity change and appetite change.  HENT:  Positive for dental problem. Negative for sinus pressure and sore throat.   Respiratory:  Negative for chest tightness, shortness of breath and wheezing.   Cardiovascular:  Negative  for chest pain and palpitations.  Gastrointestinal:  Negative for abdominal distention, abdominal pain and constipation.  Genitourinary: Negative.   Musculoskeletal: Negative.   Psychiatric/Behavioral:  Negative for behavioral problems and dysphoric mood.     Objective:  BP 134/81   Pulse 60   Ht 5' (1.524 m)   Wt 167 lb 9.6 oz (76 kg)   LMP 09/01/2018   SpO2 99%   BMI 32.73 kg/m      03/09/2023    2:16 PM 01/26/2023    5:07 PM 01/26/2023  4:28 PM  BP/Weight  Systolic BP 134 154 159  Diastolic BP 81 84 88  Wt. (Lbs) 167.6  172.6  BMI 32.73 kg/m2  33.71 kg/m2      Physical Exam Constitutional:      Appearance: She is well-developed.  Cardiovascular:     Rate and Rhythm: Normal rate.     Heart sounds: Normal heart sounds. No murmur heard. Pulmonary:     Effort: Pulmonary effort is normal.     Breath sounds: Normal breath sounds. No wheezing or rales.  Chest:     Chest wall: No tenderness.  Abdominal:     General: Bowel sounds are normal. There is no distension.     Palpations: Abdomen is soft. There is no mass.     Tenderness: There is no abdominal tenderness.  Musculoskeletal:        General: Normal range of motion.     Right lower leg: No edema.     Left lower leg: No edema.  Neurological:     Mental Status: She is alert and oriented to person, place, and time.  Psychiatric:        Mood and Affect: Mood normal.        Latest Ref Rng & Units 01/30/2023    8:57 AM 05/06/2022    9:33 AM 09/21/2019    6:04 PM  CMP  Glucose 70 - 99 mg/dL 440  102  725   BUN 6 - 24 mg/dL 14  16  8    Creatinine 0.57 - 1.00 mg/dL 3.66  4.40  3.47   Sodium 134 - 144 mmol/L 140  141  138   Potassium 3.5 - 5.2 mmol/L 4.3  4.4  2.9   Chloride 96 - 106 mmol/L 105  101  104   CO2 20 - 29 mmol/L 22  24  24    Calcium 8.7 - 10.2 mg/dL 9.0  42.5  9.1   Total Protein 6.0 - 8.5 g/dL 6.9  7.9    Total Bilirubin 0.0 - 1.2 mg/dL 0.2  0.3    Alkaline Phos 44 - 121 IU/L 82  115    AST 0 -  40 IU/L 17  23    ALT 0 - 32 IU/L 14  18      Lipid Panel     Component Value Date/Time   CHOL 193 01/30/2023 0857   TRIG 150 (H) 01/30/2023 0857   HDL 47 01/30/2023 0857   CHOLHDL 4.1 Ratio 06/22/2007 0000   VLDL 38 06/22/2007 0000   LDLCALC 119 (H) 01/30/2023 0857    CBC    Component Value Date/Time   WBC 9.7 01/30/2023 0857   WBC 11.9 (H) 09/21/2019 1804   RBC 4.40 01/30/2023 0857   RBC 4.89 09/21/2019 1804   HGB 12.4 01/30/2023 0857   HCT 38.7 01/30/2023 0857   PLT 259 01/30/2023 0857   MCV 88 01/30/2023 0857   MCH 28.2 01/30/2023 0857   MCH 28.6 09/21/2019 1804   MCHC 32.0 01/30/2023 0857   MCHC 32.5 09/21/2019 1804   RDW 13.9 01/30/2023 0857   LYMPHSABS 3.8 (H) 01/30/2023 0857   MONOABS 0.7 04/22/2013 1919   EOSABS 0.1 01/30/2023 0857   BASOSABS 0.1 01/30/2023 0857    Lab Results  Component Value Date   HGBA1C 6.5 (H) 01/30/2023    Assessment & Plan:      Hypertension Well controlled on Valsartan and Hydrochlorothiazide. No reported side effects. -Continue Valsartan and Hydrochlorothiazide. -Counseled on blood pressure  goal of less than 130/80, low-sodium, DASH diet, medication compliance, 150 minutes of moderate intensity exercise per week. Discussed medication compliance, adverse effects.   Dental Pain Reports of intermittent pain in molar tooth for the past 4 months. Reports of bleeding gums when brushing. -Refer to dentist for further evaluation and treatment. -Placed on Augmentin  Type 2 Diabetes Newly diagnosed with HbA1c of 6.5. Patient has been advised on lifestyle modifications including diet and exercise. -Start Metformin as prescribed. -Provide patient with information on healthy eating with diabetes. -Order annual foot and eye exam. -Prescribe glucose meter for patient to monitor blood sugar levels at home 2-3 times per week.          Meds ordered this encounter  Medications   carvedilol (COREG) 6.25 MG tablet    Sig: Take 1  tablet (6.25 mg total) by mouth 2 (two) times daily with a meal.    Dispense:  180 tablet    Refill:  1    Discontinue amlodipine   spironolactone (ALDACTONE) 50 MG tablet    Sig: Take 1 tablet (50 mg total) by mouth daily.    Dispense:  90 tablet    Refill:  1   amoxicillin-clavulanate (AUGMENTIN) 875-125 MG tablet    Sig: Take 1 tablet by mouth 2 (two) times daily.    Dispense:  20 tablet    Refill:  0   glucose blood (TRUE METRIX BLOOD GLUCOSE TEST) test strip    Sig: Use 3 times daily    Dispense:  100 each    Refill:  12   Blood Glucose Monitoring Suppl (TRUE METRIX METER) DEVI    Sig: 1 each by Does not apply route 3 (three) times daily before meals.    Dispense:  1 each    Refill:  0   TRUEplus Lancets 28G MISC    Sig: 1 each by Does not apply route 3 (three) times daily before meals.    Dispense:  100 each    Refill:  12    Follow-up: Return in about 6 months (around 09/09/2023) for Chronic medical conditions.       Hoy Register, MD, FAAFP. [facility] [place], [place] [phone]   03/09/2023, 2:43 PM

## 2023-03-20 ENCOUNTER — Other Ambulatory Visit: Payer: Self-pay | Admitting: *Deleted

## 2023-03-20 DIAGNOSIS — K219 Gastro-esophageal reflux disease without esophagitis: Secondary | ICD-10-CM

## 2023-03-20 DIAGNOSIS — K760 Fatty (change of) liver, not elsewhere classified: Secondary | ICD-10-CM

## 2023-03-20 DIAGNOSIS — R140 Abdominal distension (gaseous): Secondary | ICD-10-CM

## 2023-04-23 ENCOUNTER — Ambulatory Visit: Payer: Self-pay | Admitting: Physician Assistant

## 2023-05-14 ENCOUNTER — Ambulatory Visit: Payer: Self-pay | Attending: Physician Assistant | Admitting: Family Medicine

## 2023-05-14 ENCOUNTER — Encounter: Payer: Self-pay | Admitting: Family Medicine

## 2023-05-14 VITALS — BP 162/91 | HR 67 | Ht 60.0 in | Wt 165.2 lb

## 2023-05-14 DIAGNOSIS — G8929 Other chronic pain: Secondary | ICD-10-CM

## 2023-05-14 DIAGNOSIS — K089 Disorder of teeth and supporting structures, unspecified: Secondary | ICD-10-CM

## 2023-05-14 DIAGNOSIS — F458 Other somatoform disorders: Secondary | ICD-10-CM

## 2023-05-14 MED ORDER — AMOXICILLIN-POT CLAVULANATE 875-125 MG PO TABS: 1.0000 | ORAL_TABLET | Freq: Two times a day (BID) | ORAL | 0 refills | Status: DC

## 2023-05-14 NOTE — Patient Instructions (Signed)
Protectores bucales dentales para deportes Designer, jewellery for Sports Un protector dental es un dispositivo que se coloca alrededor de los dientes y las encas. Tambin se llama protector bucal. Los protectores bucales se usan cuando se practican deportes y se realizan actividades que conllevan un riesgo de lesiones en la zona de la boca. Un protector bucal bien [employer], las encas y los labios de sufrir una lesin. Por qu debo Production designer, theatre/television/film bucal? Los protectores bucales pueden protegerlo de lesiones si recibe un golpe fuerte en la boca y la zona de la [place]. El uso de un protector bucal puede evitar lo siguiente: La rotura o el astillado de un diente. La cada de un diente. Dao a los nervios que DIRECTV. Lesiones [employer], los labios o la [place]. Cundo debo Production designer, theatre/television/film bucal? Debe usar un protector bucal mientras realiza cualquier actividad que conlleve un riesgo de sufrir una lesin dental, especialmente: Deportes de contacto. El riesgo es mayor en esta categora de deportes. Estos incluyen boxeo, ftbol americano, lacrosse y hockey. Deportes que no son de contacto. El riesgo de lesin dental es bajo en esta categora de deportes. Sin embargo, se recomienda el uso de un protector bucal. Estos incluyen esquiar, patinar o andar en bicicleta. Se debe usar un protector bucal durante los partidos y tambin en las prcticas. Cules son los diferentes tipos de protectores bucales?     Algunos protectores bucales solo [employer], y otros cubren tanto los superiores [employer]. Cada tipo de protector bucal tiene un calce diferente y puede ofrecer distintos niveles de proteccin, comodidad y facilidad para la respiracin. Los tipos de protectores bucales [employer] siguientes: Stock. Estos protectores bucales vienen con un espacio premoldeado para que los dientes calcen [place]. Se pueden comprar en tiendas de  artculos deportivos de su localidad o en la farmacia. Debido a que estn premoldeados, no calzan igual de bien, pueden ser menos cmodos y no pueden proporcionar el mismo nivel de proteccin que un protector bucal hecho a medida. "Hervir y morder" (Boil and bite). Este tipo de Environmental manager bucal ofrece un calce un poco mejor que el del tipo stock. Tras comprar este producto de proteccin, se coloca en agua hirviendo para Social worker. Cuando todava est blando, se lo coloca en la boca para que se adapte a la forma de los dientes. Estos protectores bucales se usan frecuentemente para los deportes en equipo. Se pueden comprar en tiendas de artculos deportivos de su localidad o en la farmacia. A medida. Este tipo es realizado por un proveedor de Ship broker. Es la opcin ms cara, pero debido a que Therapist, sports se moldea exactamente para adaptarse a su boca y dientes, ofrece el ms alto nivel de proteccin. Cmo elijo un protector bucal? Analice con el proveedor de Ship broker los tipos de actividades fsicas que Education officer, environmental. Pregntele al proveedor qu tipo de protector bucal puede ser el adecuado para usted. En general, el protector bucal debe: Ser de Conservation officer, nature. Ser inodoro e inspido. Calzar correctamente y ser lo bastante grueso para brindar proteccin. Ser fcil de limpiar. Permitirle respirar y Insurance risk surveyor. Las personas que usan aparatos de ortodoncia, como frenillos, Geneticist, molecular un protector bucal hecho a medida. Cmo cuido el protector bucal? Para mantener limpio el protector bucal, haga lo siguiente: Enjuguelo o cepllelo con agua y dentfrico, antes y despus de usarlo. Lvelo habitualmente con agua fra y [place]. Gurdelo en un estuche protector  ventilado. Protjalo de las altas temperaturas, de modo que no pierda su forma. No use agua caliente al lavarlo o enjuagarlo. No lo deje dentro de un [employer] de verano. No use aparatos  removibles, como retenedores, Airline pilot deportes de contacto o debajo del protector bucal. Reemplace el protector bucal cuando muestre signos de desgaste o cuando ya no calce correctamente. Lleve el protector bucal a la siguiente consulta con el proveedor de Ship broker para que este pueda evaluarlo. Resumen Un protector dental es un dispositivo que se coloca alrededor de los dientes y las encas. Tambin se llama protector bucal. Los protectores bucales pueden protegerlo de lesiones si recibe un golpe fuerte en la boca y la zona de la [place]. Debe usar un protector bucal mientras realiza cualquier actividad que conlleve un riesgo de sufrir una lesin dental. Analice con el proveedor de American International Group tipos de actividades fsicas que Biomedical engineer. Pregntele al proveedor qu tipo de protector bucal puede ser el adecuado para usted. Mantenga el protector bucal limpio y gurdelo en un estuche protector que tenga buena circulacin de aire. Esta informacin no tiene Theme park manager el consejo del mdico. Asegrese de hacerle al mdico cualquier pregunta que tenga. Document Revised: 05/23/2021 Document Reviewed: 03/27/2021 Elsevier Patient Education  2024 [employer].

## 2023-05-14 NOTE — Progress Notes (Signed)
Subjective:  Patient ID: Alexandra Wu, female    DOB: 1971/01/17  Age: 52 y.o. MRN: [id number]  CC: Dental Pain   HPI Alexandra Wu is a 52 y.o. year old female with a history of hypertension, 2 diabetes mellitus here for an office visit.  She had a visit for disease management 3 months ago and was scheduled to return for 30-month follow-up in 08/2023.  Interval History: Discussed the use of AI scribe software for clinical note transcription with the patient, who gave verbal consent to proceed.  The patient, with a history of dental issues, presents with a chief complaint of toothache. The discomfort has been ongoing for at least three months. The patient reports that all her teeth hurt, particularly at night when she grinds her teeth. This grinding causes a strong pressure and pain that disrupts her sleep. In addition to the toothache, the patient reports that the bone of her jaw is protruding from her gums. She also mentions that one specific tooth sometimes swells and bleeds. The patient had previously been referred to a dentist in August, but she was unable to receive treatment due to insurance issues.        Past Medical History:  Diagnosis Date   Appendicitis, acute 04/23/2013   Lap appendectomy on 03/23/13   Dyslipidemia 03/24/2017   Hyperglycemia 02/17/2017   Hypertension    Nodular goiter 03/24/2017   Thyromegaly 02/17/2017    Past Surgical History:  Procedure Laterality Date   LAPAROSCOPIC APPENDECTOMY N/A 04/23/2013   Procedure: APPENDECTOMY LAPAROSCOPIC;  Surgeon: Robyne Askew, MD;  Location: [facility] OR;  Service: General;  Laterality: N/A;   TUBAL LIGATION  1997    Family History  Problem Relation Age of Onset   Arthritis Mother    Hypertension Mother    Arthritis Sister    Diabetes Sister    Arthritis Sister    Heart disease Brother    Headache Daughter    Colon cancer Neg Hx    Esophageal cancer Neg Hx    Stomach cancer Neg Hx     Rectal cancer Neg Hx     Social History   Socioeconomic History   Marital status: Married    Spouse name: Personal assistant   Number of children: 3   Years of education: 1   Highest education level: Not on file  Occupational History   Occupation: Pensions consultant for offices   Occupation: not working  Tobacco Use   Smoking status: Never   Smokeless tobacco: Never  Vaping Use   Vaping status: Never Used  Substance and Sexual Activity   Alcohol use: No   Drug use: No   Sexual activity: Yes    Birth control/protection: Surgical  Other Topics Concern   Not on file  Social History Narrative   [place]   [place]   Lives at home with husband   Social Determinants of Health   Financial Resource Strain: Low Risk  (10/29/2022)   Overall Financial Resource Strain (CARDIA)    Difficulty of Paying Living Expenses: Not very hard  Food Insecurity: No Food Insecurity (10/29/2022)   Hunger Vital Sign    Worried About Running Out of Food in the Last Year: Never true    Ran Out of Food in the Last Year: Never true  Transportation Needs: No Transportation Needs (10/29/2022)   PRAPARE - Administrator, Civil Service (Medical): No  Lack of Transportation (Non-Medical): No  Physical Activity: Insufficiently Active (10/29/2022)   Exercise Vital Sign    Days of Exercise per Week: 3 days    Minutes of Exercise per Session: 40 min  Stress: No Stress Concern Present (10/29/2022)   [employer] of Occupational Health - Occupational Stress Questionnaire    Feeling of Stress : Only a little  Social Connections: Socially Integrated (10/29/2022)   Social Connection and Isolation Panel [NHANES]    Frequency of Communication with Friends and Family: Three times a week    Frequency of Social Gatherings with Friends and Family: Twice a week    Attends Religious Services: More than 4 times per year    Active Member of [employer] or Organizations: Yes    Attends  Engineer, structural: More than 4 times per year    Marital Status: Married    No Known Allergies  Outpatient Medications Prior to Visit  Medication Sig Dispense Refill   atorvastatin (LIPITOR) 20 MG tablet Take 1 tablet (20 mg total) by mouth daily. 90 tablet 1   Blood Glucose Monitoring Suppl (TRUE METRIX METER) DEVI 1 each by Does not apply route 3 (three) times daily before meals. 1 each 0   carvedilol (COREG) 6.25 MG tablet Take 1 tablet (6.25 mg total) by mouth 2 (two) times daily with a meal. 180 tablet 1   cetirizine (ZYRTEC) 10 MG tablet Take 1 tablet (10 mg total) by mouth daily. 90 tablet 1   fluticasone (FLONASE) 50 MCG/ACT nasal spray USE 1 SPRAY(S) IN EACH NOSTRIL TWICE DAILY AS NEEDED     glucose blood (TRUE METRIX BLOOD GLUCOSE TEST) test strip Use 3 times daily 100 each 12   hydrOXYzine (ATARAX) 25 MG tablet Take 1 tablet (25 mg total) by mouth at bedtime as needed. 30 tablet 0   metFORMIN (GLUCOPHAGE) 500 MG tablet Take 1 tablet (500 mg total) by mouth daily with breakfast. 90 tablet 1   Multiple Vitamins-Minerals (MULTIVITAMIN ADULT, MINERALS,) TABS multivitamin  i tab po qd     olopatadine (PATANOL) 0.1 % ophthalmic solution INSTILL 1 DROP INTO AFFECTED EYE(S) BY OPHTHALMIC ROUTE 2 TIMES PER DAY AT AN INTERVAL OF 6 TO 8 HOURS     Omega-3 Fatty Acids (OMEGA-3 FISH OIL PO) Take by mouth daily.     pantoprazole (PROTONIX) 40 MG tablet Take 1 tablet (40 mg total) by mouth 2 (two) times daily. 180 tablet 3   predniSONE (DELTASONE) 20 MG tablet Take 1 tablet (20 mg total) by mouth daily with breakfast. (Patient not taking: Reported on 03/09/2023) 5 tablet 0   spironolactone (ALDACTONE) 50 MG tablet Take 1 tablet (50 mg total) by mouth daily. 90 tablet 1   tiZANidine (ZANAFLEX) 4 MG tablet Take 1 tablet (4 mg total) by mouth every 8 (eight) hours as needed for muscle spasms. 90 tablet 1   TRUEplus Lancets 28G MISC 1 each by Does not apply route 3 (three) times daily  before meals. 100 each 12   valsartan-hydrochlorothiazide (DIOVAN-HCT) 80-12.5 MG tablet Take 1 tablet by mouth daily. 90 tablet 1   amoxicillin-clavulanate (AUGMENTIN) 875-125 MG tablet Take 1 tablet by mouth 2 (two) times daily. 20 tablet 0   No facility-administered medications prior to visit.     ROS Review of Systems  Constitutional:  Negative for activity change and appetite change.  HENT:  Positive for dental problem. Negative for sinus pressure and sore throat.   Respiratory:  Negative for chest tightness, shortness  of breath and wheezing.   Cardiovascular:  Negative for chest pain and palpitations.  Gastrointestinal:  Negative for abdominal distention, abdominal pain and constipation.  Genitourinary: Negative.   Musculoskeletal: Negative.   Psychiatric/Behavioral:  Negative for behavioral problems and dysphoric mood.     Objective:  BP (!) 162/91   Pulse 67   Ht 5' (1.524 m)   Wt 165 lb 3.2 oz (74.9 kg)   LMP 09/01/2018   SpO2 100%   BMI 32.26 kg/m      05/14/2023   11:01 AM 03/09/2023    2:16 PM 01/26/2023    5:07 PM  BP/Weight  Systolic BP 162 134 154  Diastolic BP 91 81 84  Wt. (Lbs) 165.2 167.6   BMI 32.26 kg/m2 32.73 kg/m2       Physical Exam Constitutional:      Appearance: She is well-developed.  Cardiovascular:     Rate and Rhythm: Normal rate.     Heart sounds: Normal heart sounds. No murmur heard. Pulmonary:     Effort: Pulmonary effort is normal.     Breath sounds: Normal breath sounds. No wheezing or rales.  Chest:     Chest wall: No tenderness.  Abdominal:     General: Bowel sounds are normal. There is no distension.     Palpations: Abdomen is soft. There is no mass.     Tenderness: There is no abdominal tenderness.  Musculoskeletal:        General: Normal range of motion.     Right lower leg: No edema.     Left lower leg: No edema.  Neurological:     Mental Status: She is alert and oriented to person, place, and time.  Psychiatric:         Mood and Affect: Mood normal.        Latest Ref Rng & Units 01/30/2023    8:57 AM 05/06/2022    9:33 AM 09/21/2019    6:04 PM  CMP  Glucose 70 - 99 mg/dL 562  130  865   BUN 6 - 24 mg/dL 14  16  8    Creatinine 0.57 - 1.00 mg/dL 7.84  6.96  2.95   Sodium 134 - 144 mmol/L 140  141  138   Potassium 3.5 - 5.2 mmol/L 4.3  4.4  2.9   Chloride 96 - 106 mmol/L 105  101  104   CO2 20 - 29 mmol/L 22  24  24    Calcium 8.7 - 10.2 mg/dL 9.0  28.4  9.1   Total Protein 6.0 - 8.5 g/dL 6.9  7.9    Total Bilirubin 0.0 - 1.2 mg/dL 0.2  0.3    Alkaline Phos 44 - 121 IU/L 82  115    AST 0 - 40 IU/L 17  23    ALT 0 - 32 IU/L 14  18      Lipid Panel     Component Value Date/Time   CHOL 193 01/30/2023 0857   TRIG 150 (H) 01/30/2023 0857   HDL 47 01/30/2023 0857   CHOLHDL 4.1 Ratio 06/22/2007 0000   VLDL 38 06/22/2007 0000   LDLCALC 119 (H) 01/30/2023 0857    CBC    Component Value Date/Time   WBC 9.7 01/30/2023 0857   WBC 11.9 (H) 09/21/2019 1804   RBC 4.40 01/30/2023 0857   RBC 4.89 09/21/2019 1804   HGB 12.4 01/30/2023 0857   HCT 38.7 01/30/2023 0857   PLT 259 01/30/2023 0857  MCV 88 01/30/2023 0857   MCH 28.2 01/30/2023 0857   MCH 28.6 09/21/2019 1804   MCHC 32.0 01/30/2023 0857   MCHC 32.5 09/21/2019 1804   RDW 13.9 01/30/2023 0857   LYMPHSABS 3.8 (H) 01/30/2023 0857   MONOABS 0.7 04/22/2013 1919   EOSABS 0.1 01/30/2023 0857   BASOSABS 0.1 01/30/2023 0857    Lab Results  Component Value Date   HGBA1C 6.5 (H) 01/30/2023    Assessment & Plan:      Dental Pain Chronic toothache with nocturnal bruxism. No specific tooth identified. No swelling or pus noted on examination. -Referral to dentist for further evaluation and treatment. -Prescribe antibiotics for potential infection. -Recommend over-the-counter mouth guard for bruxism. -Advise gargling with warm salt water to reduce oral bacteria.       Meds ordered this encounter  Medications    amoxicillin-clavulanate (AUGMENTIN) 875-125 MG tablet    Sig: Take 1 tablet by mouth 2 (two) times daily.    Dispense:  20 tablet    Refill:  0    Follow-up: Return for previously scheduled appointment.       Hoy Register, MD, FAAFP. [facility] [place], [place] [phone]   05/14/2023, 11:58 AM

## 2023-05-29 ENCOUNTER — Telehealth (INDEPENDENT_AMBULATORY_CARE_PROVIDER_SITE_OTHER): Payer: Self-pay

## 2023-05-29 NOTE — Telephone Encounter (Signed)
Copied from CRM [id number]. Topic: General - Inquiry >> May 28, 2023  4:05 PM Alexandra Wu wrote: Patient has called and states she has an orange card but when she comes to Dr Baxter Flattery office they tell her that she does not have medical coverage and won't accept it at the front desk, for Dr Alvis Lemmings. Patient would like a call back about this.  Patient's callback # [phone]

## 2023-07-15 ENCOUNTER — Other Ambulatory Visit: Payer: Self-pay | Admitting: Family Medicine

## 2023-07-15 DIAGNOSIS — I10 Essential (primary) hypertension: Secondary | ICD-10-CM

## 2023-07-15 NOTE — Telephone Encounter (Signed)
Requested Prescriptions  Pending Prescriptions Disp Refills   valsartan-hydrochlorothiazide (DIOVAN-HCT) 80-12.5 MG tablet [Pharmacy Med Name: Valsartan-hydroCHLOROthiazide 80-12.5 MG Oral Tablet] 90 tablet 0    Sig: Take 1 tablet by mouth once daily     Cardiovascular: ARB + Diuretic Combos Failed - 07/15/2023  2:09 PM      Failed - Last BP in normal range    BP Readings from Last 1 Encounters:  05/14/23 (!) 162/91         Passed - K in normal range and within 180 days    Potassium  Date Value Ref Range Status  01/30/2023 4.3 3.5 - 5.2 mmol/L Final         Passed - Na in normal range and within 180 days    Sodium  Date Value Ref Range Status  01/30/2023 140 134 - 144 mmol/L Final         Passed - Cr in normal range and within 180 days    Creatinine, Ser  Date Value Ref Range Status  01/30/2023 0.70 0.57 - 1.00 mg/dL Final         Passed - eGFR is 10 or above and within 180 days    GFR calc Af Amer  Date Value Ref Range Status  09/21/2019 >60 >60 mL/min Final   GFR calc non Af Amer  Date Value Ref Range Status  09/21/2019 >60 >60 mL/min Final   eGFR  Date Value Ref Range Status  01/30/2023 103 >59 mL/min/1.73 Final         Passed - Patient is not pregnant      Passed - Valid encounter within last 6 months    Recent Outpatient Visits           2 months ago Chronic dental pain   [facility] [place] Hoy Register, MD   4 months ago Chronic dental pain   [place] [place]. [facility] Hoy Register, MD   5 months ago Other insomnia   [facility] [facility] [place]. [facility] Hoy Register, MD   7 months ago Essential hypertension   [facility] [place]. [facility] Drucilla Chalet, RPH-CPP   8 months ago Essential hypertension    [facility] [facility]  [place]. [facility] Lois Huxley, Cornelius Moras, RPH-CPP       Future Appointments             In 1 month Hoy Register, MD [facility] [place] [facility] Of . [facility]

## 2023-08-06 ENCOUNTER — Telehealth: Payer: Self-pay

## 2023-08-06 NOTE — Telephone Encounter (Signed)
 Order sent to the schedulers   Left message on machine to call back

## 2023-08-06 NOTE — Telephone Encounter (Signed)
-----   Message from Nurse Lavergne Hiltunen P sent at 07/30/2023  9:54 AM EST -----  ----- Message ----- From: Loretha Stapler, RN Sent: 07/23/2023  12:00 AM EST To: Loretha Stapler, RN  Lets plan for right upper quadrant ultrasound in December or January.

## 2023-08-07 NOTE — Telephone Encounter (Signed)
 The pt advised via interpreter and agrees to proceed.

## 2023-09-09 ENCOUNTER — Other Ambulatory Visit: Payer: Self-pay

## 2023-09-09 ENCOUNTER — Encounter: Payer: Self-pay | Admitting: Family Medicine

## 2023-09-09 ENCOUNTER — Ambulatory Visit: Payer: Self-pay | Attending: Family Medicine | Admitting: Family Medicine

## 2023-09-09 VITALS — BP 149/91 | HR 68 | Ht 60.0 in | Wt 165.4 lb

## 2023-09-09 DIAGNOSIS — E1169 Type 2 diabetes mellitus with other specified complication: Secondary | ICD-10-CM

## 2023-09-09 DIAGNOSIS — R252 Cramp and spasm: Secondary | ICD-10-CM

## 2023-09-09 DIAGNOSIS — H579 Unspecified disorder of eye and adnexa: Secondary | ICD-10-CM

## 2023-09-09 DIAGNOSIS — J302 Other seasonal allergic rhinitis: Secondary | ICD-10-CM

## 2023-09-09 DIAGNOSIS — I10 Essential (primary) hypertension: Secondary | ICD-10-CM

## 2023-09-09 DIAGNOSIS — Z7984 Long term (current) use of oral hypoglycemic drugs: Secondary | ICD-10-CM

## 2023-09-09 DIAGNOSIS — Z1231 Encounter for screening mammogram for malignant neoplasm of breast: Secondary | ICD-10-CM

## 2023-09-09 LAB — POCT GLYCOSYLATED HEMOGLOBIN (HGB A1C): HbA1c, POC (controlled diabetic range): 6 % (ref 0.0–7.0)

## 2023-09-09 MED ORDER — CETIRIZINE HCL 10 MG PO TABS
10.0000 mg | ORAL_TABLET | Freq: Every day | ORAL | 1 refills | Status: DC
Start: 2023-09-09 — End: 2023-09-09

## 2023-09-09 MED ORDER — TIZANIDINE HCL 4 MG PO TABS
4.0000 mg | ORAL_TABLET | Freq: Three times a day (TID) | ORAL | 1 refills | Status: AC | PRN
Start: 2023-09-09 — End: ?
  Filled 2023-09-09: qty 90, 30d supply, fill #0

## 2023-09-09 MED ORDER — CARVEDILOL 6.25 MG PO TABS
6.2500 mg | ORAL_TABLET | Freq: Two times a day (BID) | ORAL | 1 refills | Status: DC
Start: 2023-09-09 — End: 2024-03-09
  Filled 2023-09-09: qty 180, 90d supply, fill #0
  Filled 2024-03-09 (×2): qty 180, 90d supply, fill #1

## 2023-09-09 MED ORDER — VALSARTAN-HYDROCHLOROTHIAZIDE 80-12.5 MG PO TABS
1.0000 | ORAL_TABLET | Freq: Every day | ORAL | 1 refills | Status: DC
Start: 2023-09-09 — End: 2023-09-09

## 2023-09-09 MED ORDER — VALSARTAN-HYDROCHLOROTHIAZIDE 80-12.5 MG PO TABS
1.0000 | ORAL_TABLET | Freq: Every day | ORAL | 1 refills | Status: DC
Start: 2023-09-09 — End: 2024-03-09
  Filled 2023-09-09 – 2023-09-10 (×2): qty 90, 90d supply, fill #0
  Filled 2024-03-09: qty 90, 90d supply, fill #1

## 2023-09-09 MED ORDER — TIZANIDINE HCL 4 MG PO TABS: 4.0000 mg | ORAL_TABLET | Freq: Three times a day (TID) | ORAL | 1 refills | Status: DC | PRN

## 2023-09-09 MED ORDER — CARVEDILOL 6.25 MG PO TABS
6.2500 mg | ORAL_TABLET | Freq: Two times a day (BID) | ORAL | 1 refills | Status: DC
Start: 2023-09-09 — End: 2023-09-09

## 2023-09-09 MED ORDER — SPIRONOLACTONE 50 MG PO TABS
50.0000 mg | ORAL_TABLET | Freq: Every day | ORAL | 1 refills | Status: DC
  Filled 2023-09-09: qty 90, 90d supply, fill #0
  Filled 2024-03-09: qty 90, 90d supply, fill #1

## 2023-09-09 MED ORDER — SPIRONOLACTONE 50 MG PO TABS
50.0000 mg | ORAL_TABLET | Freq: Every day | ORAL | 1 refills | Status: DC
Start: 2023-09-09 — End: 2023-09-09

## 2023-09-09 MED ORDER — CETIRIZINE HCL 10 MG PO TABS
10.0000 mg | ORAL_TABLET | Freq: Every day | ORAL | 1 refills | Status: AC
Start: 2023-09-09 — End: ?
  Filled 2023-09-09: qty 90, 90d supply, fill #0
  Filled 2024-05-05 – 2024-05-10 (×2): qty 90, 90d supply, fill #1

## 2023-09-09 MED ORDER — ATORVASTATIN CALCIUM 20 MG PO TABS: 20.0000 mg | ORAL_TABLET | Freq: Every day | ORAL | 1 refills | Status: DC

## 2023-09-09 NOTE — Patient Instructions (Signed)
VISIT SUMMARY:  During today's visit, we discussed your elevated blood pressure readings in the clinic, eye discomfort, muscle cramps, and your diabetes management. We also reviewed your general health maintenance needs.  YOUR PLAN:  -HYPERTENSION: Hypertension means high blood pressure. Although your blood pressure was elevated in the clinic, you report normal readings at home. Continue taking your current medication, monitor your blood pressure at home, and follow up in 3 months.  -EYE DISCOMFORT: You reported itchiness in your eyes. We will refer you to an eye doctor for further evaluation.  -MUSCLE CRAMPS: Muscle cramps are sudden, involuntary contractions of one or more muscles. You have been experiencing frequent cramps in your feet and hands. We will check your potassium and magnesium levels and have prescribed Tizanidine to help relieve the symptoms as needed.  -DIABETES MELLITUS: Diabetes Mellitus is a condition where your blood sugar levels are too high. Your A1c is well controlled at 6.0. Continue taking Metformin as prescribed.  -GENERAL HEALTH MAINTENANCE: We administered your influenza vaccine today and placed a referral for a mammogram. All your current medications, including those for acid reflux, have been refilled.  INSTRUCTIONS:  Please monitor your blood pressure at home and keep a log of your readings. Follow up in 3 months for your hypertension. We have placed a referral for you to see an eye doctor and for a mammogram. We will also check your potassium and magnesium levels to address your muscle cramps. Continue taking your medications as prescribed.

## 2023-09-09 NOTE — Progress Notes (Signed)
Subjective:  Patient ID: Alexandra Wu, female    DOB: [birth year]  Age: 53 y.o. MRN: [id number]  CC: Medical Management of Chronic Issues (Referral to eye doctor/Muscle craps)   HPI Alexandra Wu is a 53 y.o. year old female with a history of hypertension, 2 diabetes mellitus here for an office visit.   Interval History: Discussed the use of AI scribe software for clinical note transcription with the patient, who gave verbal consent to proceed.        She presents with elevated blood pressure at the clinic, although she reports normal readings at home. She has been adherent to her antihypertensive medication.  For her diabetes she has been on metformin and endorses adherence. She exercises by means of walking regularly.   She also reports itchiness in the eyes and requests a referral to an eye doctor. The patient has been experiencing frequent muscle cramps in the feet and hands, which occur at any time of the day and are not associated with any specific activity. The patient also requests refills of her current medications.    Past Medical History:  Diagnosis Date   Appendicitis, acute 04/23/2013   Lap appendectomy on 03/23/13   Dyslipidemia 03/24/2017   Hyperglycemia 02/17/2017   Hypertension    Nodular goiter 03/24/2017   Thyromegaly 02/17/2017    Past Surgical History:  Procedure Laterality Date   LAPAROSCOPIC APPENDECTOMY N/A 04/23/2013   Procedure: APPENDECTOMY LAPAROSCOPIC;  Surgeon: Robyne Askew, MD;  Location: [facility] OR;  Service: General;  Laterality: N/A;   TUBAL LIGATION  1997    Family History  Problem Relation Age of Onset   Arthritis Mother    Hypertension Mother    Arthritis Sister    Diabetes Sister    Arthritis Sister    Heart disease Brother    Headache Daughter    Colon cancer Neg Hx    Esophageal cancer Neg Hx    Stomach cancer Neg Hx    Rectal cancer Neg Hx     Social History   Socioeconomic History   Marital  status: Married    Spouse name: Personal assistant   Number of children: 3   Years of education: 1   Highest education level: Not on file  Occupational History   Occupation: Pensions consultant for offices   Occupation: not working  Tobacco Use   Smoking status: Never   Smokeless tobacco: Never  Vaping Use   Vaping status: Never Used  Substance and Sexual Activity   Alcohol use: No   Drug use: No   Sexual activity: Yes    Birth control/protection: Surgical  Other Topics Concern   Not on file  Social History Narrative   Originally [place]   Came [place] In [place]   Lives at home with husband   Social Drivers of Health   Financial Resource Strain: Low Risk  (10/29/2022)   Overall Financial Resource Strain (CARDIA)    Difficulty of Paying Living Expenses: Not very hard  Food Insecurity: No Food Insecurity (10/29/2022)   Hunger Vital Sign    Worried About Running Out of Food in the Last Year: Never true    Ran Out of Food in the Last Year: Never true  Transportation Needs: No Transportation Needs (10/29/2022)   PRAPARE - Administrator, Civil Service (Medical): No    Lack of Transportation (Non-Medical): No  Physical Activity: Insufficiently Active (10/29/2022)   Exercise Vital Sign  Days of Exercise per Week: 3 days    Minutes of Exercise per Session: 40 min  Stress: No Stress Concern Present (10/29/2022)   [employer] of Occupational Health - Occupational Stress Questionnaire    Feeling of Stress : Only a little  Social Connections: Socially Integrated (10/29/2022)   Social Connection and Isolation Panel [NHANES]    Frequency of Communication with Friends and Family: Three times a week    Frequency of Social Gatherings with Friends and Family: Twice a week    Attends Religious Services: More than 4 times per year    Active Member of Clubs or Organizations: Yes    Attends Engineer, structural: More than 4 times per year    Marital Status: Married     No Known Allergies  Outpatient Medications Prior to Visit  Medication Sig Dispense Refill   Blood Glucose Monitoring Suppl (TRUE METRIX METER) DEVI 1 each by Does not apply route 3 (three) times daily before meals. 1 each 0   fluticasone (FLONASE) 50 MCG/ACT nasal spray USE 1 SPRAY(S) IN EACH NOSTRIL TWICE DAILY AS NEEDED     glucose blood (TRUE METRIX BLOOD GLUCOSE TEST) test strip Use 3 times daily 100 each 12   metFORMIN (GLUCOPHAGE) 500 MG tablet Take 1 tablet (500 mg total) by mouth daily with breakfast. 90 tablet 1   Multiple Vitamins-Minerals (MULTIVITAMIN ADULT, MINERALS,) TABS multivitamin  i tab po qd     olopatadine (PATANOL) 0.1 % ophthalmic solution INSTILL 1 DROP INTO AFFECTED EYE(S) BY OPHTHALMIC ROUTE 2 TIMES PER DAY AT AN INTERVAL OF 6 TO 8 HOURS     Omega-3 Fatty Acids (OMEGA-3 FISH OIL PO) Take by mouth daily.     pantoprazole (PROTONIX) 40 MG tablet Take 1 tablet (40 mg total) by mouth 2 (two) times daily. 180 tablet 3   TRUEplus Lancets 28G MISC 1 each by Does not apply route 3 (three) times daily before meals. 100 each 12   atorvastatin (LIPITOR) 20 MG tablet Take 1 tablet (20 mg total) by mouth daily. 90 tablet 1   carvedilol (COREG) 6.25 MG tablet Take 1 tablet (6.25 mg total) by mouth 2 (two) times daily with a meal. 180 tablet 1   cetirizine (ZYRTEC) 10 MG tablet Take 1 tablet (10 mg total) by mouth daily. 90 tablet 1   spironolactone (ALDACTONE) 50 MG tablet Take 1 tablet (50 mg total) by mouth daily. 90 tablet 1   tiZANidine (ZANAFLEX) 4 MG tablet Take 1 tablet (4 mg total) by mouth every 8 (eight) hours as needed for muscle spasms. 90 tablet 1   valsartan-hydrochlorothiazide (DIOVAN-HCT) 80-12.5 MG tablet Take 1 tablet by mouth once daily 90 tablet 0   amoxicillin-clavulanate (AUGMENTIN) 875-125 MG tablet Take 1 tablet by mouth 2 (two) times daily. (Patient not taking: Reported on 09/09/2023) 20 tablet 0   hydrOXYzine (ATARAX) 25 MG tablet Take 1 tablet (25 mg  total) by mouth at bedtime as needed. (Patient not taking: Reported on 09/09/2023) 30 tablet 0   predniSONE (DELTASONE) 20 MG tablet Take 1 tablet (20 mg total) by mouth daily with breakfast. (Patient not taking: Reported on 09/09/2023) 5 tablet 0   No facility-administered medications prior to visit.     ROS Review of Systems  Constitutional:  Negative for activity change and appetite change.  HENT:  Negative for sinus pressure and sore throat.   Eyes:  Positive for itching.  Respiratory:  Negative for chest tightness, shortness of breath and wheezing.  Cardiovascular:  Negative for chest pain and palpitations.  Gastrointestinal:  Negative for abdominal distention, abdominal pain and constipation.  Genitourinary: Negative.   Musculoskeletal:        See HPI  Psychiatric/Behavioral:  Negative for behavioral problems and dysphoric mood.     Objective:  BP (!) 149/91   Pulse 68   Ht 5' (1.524 m)   Wt 165 lb 6.4 oz (75 kg)   LMP 09/01/2018   SpO2 100%   BMI 32.30 kg/m      09/09/2023    4:48 PM 09/09/2023    4:27 PM 05/14/2023   11:01 AM  BP/Weight  Systolic BP 149 174 162  Diastolic BP 91 99 91  Wt. (Lbs)  165.4 165.2  BMI  32.3 kg/m2 32.26 kg/m2      Physical Exam Constitutional:      Appearance: She is well-developed.  Cardiovascular:     Rate and Rhythm: Normal rate.     Heart sounds: Normal heart sounds. No murmur heard. Pulmonary:     Effort: Pulmonary effort is normal.     Breath sounds: Normal breath sounds. No wheezing or rales.  Chest:     Chest wall: No tenderness.  Abdominal:     General: Bowel sounds are normal. There is no distension.     Palpations: Abdomen is soft. There is no mass.     Tenderness: There is no abdominal tenderness.  Musculoskeletal:        General: Normal range of motion.     Right lower leg: No edema.     Left lower leg: No edema.  Neurological:     Mental Status: She is alert and oriented to person, place, and time.   Psychiatric:        Mood and Affect: Mood normal.        Latest Ref Rng & Units 01/30/2023    8:57 AM 05/06/2022    9:33 AM 09/21/2019    6:04 PM  CMP  Glucose 70 - 99 mg/dL 130  865  784   BUN 6 - 24 mg/dL 14  16  8    Creatinine 0.57 - 1.00 mg/dL 6.96  2.95  2.84   Sodium 134 - 144 mmol/L 140  141  138   Potassium 3.5 - 5.2 mmol/L 4.3  4.4  2.9   Chloride 96 - 106 mmol/L 105  101  104   CO2 20 - 29 mmol/L 22  24  24    Calcium 8.7 - 10.2 mg/dL 9.0  13.2  9.1   Total Protein 6.0 - 8.5 g/dL 6.9  7.9    Total Bilirubin 0.0 - 1.2 mg/dL 0.2  0.3    Alkaline Phos 44 - 121 IU/L 82  115    AST 0 - 40 IU/L 17  23    ALT 0 - 32 IU/L 14  18      Lipid Panel     Component Value Date/Time   CHOL 193 01/30/2023 0857   TRIG 150 (H) 01/30/2023 0857   HDL 47 01/30/2023 0857   CHOLHDL 4.1 Ratio 06/22/2007 0000   VLDL 38 06/22/2007 0000   LDLCALC 119 (H) 01/30/2023 0857    CBC    Component Value Date/Time   WBC 9.7 01/30/2023 0857   WBC 11.9 (H) 09/21/2019 1804   RBC 4.40 01/30/2023 0857   RBC 4.89 09/21/2019 1804   HGB 12.4 01/30/2023 0857   HCT 38.7 01/30/2023 0857   PLT 259 01/30/2023 0857  MCV 88 01/30/2023 0857   MCH 28.2 01/30/2023 0857   MCH 28.6 09/21/2019 1804   MCHC 32.0 01/30/2023 0857   MCHC 32.5 09/21/2019 1804   RDW 13.9 01/30/2023 0857   LYMPHSABS 3.8 (H) 01/30/2023 0857   MONOABS 0.7 04/22/2013 1919   EOSABS 0.1 01/30/2023 0857   BASOSABS 0.1 01/30/2023 0857    Lab Results  Component Value Date   HGBA1C 6.0 09/09/2023    Assessment & Plan:        Hypertension Elevated blood pressure in clinic, but patient reports normal readings at home. -Continue current antihypertensive regimen. -Monitor blood pressure at home. -Follow up in 3 months.  Eye discomfort Reports itchiness and sensation of needing to scratch eyes. -Prescribe Zyrtec and Patanol eyedrops with no relief in the past -Place referral for ophthalmology.  Muscle cramps Reports frequent  cramps in feet and hands, not time-specific. -Check potassium and magnesium levels. -Prescribe Tizanidine for symptomatic relief as needed.  Type II Diabetes Mellitus A1c controlled at 6.0. -Continue Metformin. -Counseled on Diabetic diet, my plate method, 409 minutes of moderate intensity exercise/week Blood sugar logs with fasting goals of 80-120 mg/dl, random of less than 811 and in the event of sugars less than 60 mg/dl or greater than 914 mg/dl encouraged to notify the clinic. Advised on the need for annual eye exams, annual foot exams, Pneumonia vaccine.   General Health Maintenance -Administer influenza vaccine today. -Place referral for mammogram. -Refill all current medications, including acid reflux medication.         Meds ordered this encounter  Medications   DISCONTD: tiZANidine (ZANAFLEX) 4 MG tablet    Sig: Take 1 tablet (4 mg total) by mouth every 8 (eight) hours as needed for muscle spasms.    Dispense:  90 tablet    Refill:  1   atorvastatin (LIPITOR) 20 MG tablet    Sig: Take 1 tablet (20 mg total) by mouth daily.    Dispense:  90 tablet    Refill:  1   DISCONTD: carvedilol (COREG) 6.25 MG tablet    Sig: Take 1 tablet (6.25 mg total) by mouth 2 (two) times daily with a meal.    Dispense:  180 tablet    Refill:  1   DISCONTD: cetirizine (ZYRTEC) 10 MG tablet    Sig: Take 1 tablet (10 mg total) by mouth daily.    Dispense:  90 tablet    Refill:  1   DISCONTD: spironolactone (ALDACTONE) 50 MG tablet    Sig: Take 1 tablet (50 mg total) by mouth daily.    Dispense:  90 tablet    Refill:  1   DISCONTD: valsartan-hydrochlorothiazide (DIOVAN-HCT) 80-12.5 MG tablet    Sig: Take 1 tablet by mouth daily.    Dispense:  90 tablet    Refill:  1   DISCONTD: tiZANidine (ZANAFLEX) 4 MG tablet    Sig: Take 1 tablet (4 mg total) by mouth every 8 (eight) hours as needed for muscle spasms.    Dispense:  90 tablet    Refill:  1   carvedilol (COREG) 6.25 MG tablet     Sig: Take 1 tablet (6.25 mg total) by mouth 2 (two) times daily with a meal.    Dispense:  180 tablet    Refill:  1   cetirizine (ZYRTEC) 10 MG tablet    Sig: Take 1 tablet (10 mg total) by mouth daily.    Dispense:  90 tablet    Refill:  1  spironolactone (ALDACTONE) 50 MG tablet    Sig: Take 1 tablet (50 mg total) by mouth daily.    Dispense:  90 tablet    Refill:  1   tiZANidine (ZANAFLEX) 4 MG tablet    Sig: Take 1 tablet (4 mg total) by mouth every 8 (eight) hours as needed for muscle spasms.    Dispense:  90 tablet    Refill:  1   valsartan-hydrochlorothiazide (DIOVAN-HCT) 80-12.5 MG tablet    Sig: Take 1 tablet by mouth daily.    Dispense:  90 tablet    Refill:  1    Follow-up: Return in about 6 months (around 03/08/2024) for Chronic medical conditions.       Hoy Register, MD, FAAFP. [facility] [place], [place] [phone]   09/09/2023, 5:11 PM

## 2023-09-10 ENCOUNTER — Other Ambulatory Visit: Payer: Self-pay

## 2023-09-11 ENCOUNTER — Other Ambulatory Visit: Payer: Self-pay

## 2023-09-11 LAB — CMP14+EGFR
ALT: 21 [IU]/L (ref 0–32)
AST: 23 [IU]/L (ref 0–40)
Albumin: 4.9 g/dL (ref 3.8–4.9)
Alkaline Phosphatase: 113 [IU]/L (ref 44–121)
BUN/Creatinine Ratio: 16 (ref 9–23)
BUN: 11 mg/dL (ref 6–24)
Bilirubin Total: <0.2 mg/dL (ref 0.0–1.2)
CO2: 26 mmol/L (ref 20–29)
Calcium: 10.3 mg/dL — ABNORMAL HIGH (ref 8.7–10.2)
Chloride: 99 mmol/L (ref 96–106)
Creatinine, Ser: 0.68 mg/dL (ref 0.57–1.00)
Globulin, Total: 3.1 g/dL (ref 1.5–4.5)
Glucose: 114 mg/dL — ABNORMAL HIGH (ref 70–99)
Potassium: 4.8 mmol/L (ref 3.5–5.2)
Sodium: 140 mmol/L (ref 134–144)
Total Protein: 8 g/dL (ref 6.0–8.5)
eGFR: 104 mL/min/{1.73_m2} (ref >59)

## 2023-09-11 LAB — MICROALBUMIN / CREATININE URINE RATIO: Creatinine, Urine: 15 mg/dL

## 2023-09-11 LAB — MAGNESIUM: Magnesium: 2.3 mg/dL (ref 1.6–2.3)

## 2023-09-12 ENCOUNTER — Encounter: Payer: Self-pay | Admitting: Family Medicine

## 2023-09-15 ENCOUNTER — Other Ambulatory Visit: Payer: Self-pay | Admitting: Obstetrics and Gynecology

## 2023-09-15 DIAGNOSIS — Z1231 Encounter for screening mammogram for malignant neoplasm of breast: Secondary | ICD-10-CM

## 2023-11-12 ENCOUNTER — Ambulatory Visit: Payer: Self-pay

## 2023-11-19 ENCOUNTER — Ambulatory Visit: Payer: Self-pay

## 2023-11-19 ENCOUNTER — Other Ambulatory Visit: Payer: Self-pay

## 2023-11-19 ENCOUNTER — Other Ambulatory Visit: Payer: Self-pay | Admitting: Pharmacist

## 2023-11-19 MED ORDER — METFORMIN HCL 500 MG PO TABS
500.0000 mg | ORAL_TABLET | Freq: Every day | ORAL | 1 refills | Status: DC
  Filled 2023-11-19: qty 90, 90d supply, fill #0
  Filled 2024-03-09: qty 90, 90d supply, fill #1

## 2023-11-25 ENCOUNTER — Ambulatory Visit: Payer: Self-pay | Admitting: Internal Medicine

## 2023-11-26 ENCOUNTER — Ambulatory Visit: Payer: Self-pay | Admitting: Physician Assistant

## 2023-11-26 ENCOUNTER — Other Ambulatory Visit: Payer: Self-pay

## 2023-11-26 ENCOUNTER — Telehealth: Payer: Self-pay | Admitting: Family Medicine

## 2023-11-26 NOTE — Telephone Encounter (Signed)
 Patient has been called and informed that medication is ready for pick up.

## 2023-11-26 NOTE — Telephone Encounter (Signed)
 Copied from CRM [id number]. Topic: Clinical - Prescription Issue  >> Nov 26, 2023  9:17 AM Alexandra Wu wrote:  Reason for CRM: metFORMIN  (GLUCOPHAGE ) 500 MG tablet was not available at the pharmacy. Patient states the pharmacy sent her to [facility] and she waited for over an hour and she did not have medicine there either. She is very confused and almost out of her meds. She would like to have some clarification on what is going on with this prescription. She said she the pharmacist called [facility] and then told her to go there and they would have the meds ready for her. In 20 mins. Something about needing a new prescription from.

## 2023-11-30 ENCOUNTER — Other Ambulatory Visit: Payer: Self-pay

## 2024-01-18 ENCOUNTER — Ambulatory Visit: Payer: Self-pay | Admitting: Internal Medicine

## 2024-01-20 ENCOUNTER — Ambulatory Visit: Payer: Self-pay | Attending: Family Medicine | Admitting: Family Medicine

## 2024-03-08 ENCOUNTER — Telehealth: Payer: Self-pay | Admitting: Family Medicine

## 2024-03-08 NOTE — Telephone Encounter (Signed)
 Called to confirm appt for 8/13 unable to LVM

## 2024-03-09 ENCOUNTER — Other Ambulatory Visit: Payer: Self-pay

## 2024-03-09 ENCOUNTER — Encounter: Payer: Self-pay | Admitting: Family Medicine

## 2024-03-09 ENCOUNTER — Ambulatory Visit: Payer: Self-pay | Attending: Family Medicine | Admitting: Family Medicine

## 2024-03-09 VITALS — BP 171/80 | HR 57 | Ht 60.0 in | Wt 165.6 lb

## 2024-03-09 DIAGNOSIS — M940 Chondrocostal junction syndrome [Tietze]: Secondary | ICD-10-CM

## 2024-03-09 DIAGNOSIS — M549 Dorsalgia, unspecified: Secondary | ICD-10-CM

## 2024-03-09 DIAGNOSIS — E119 Type 2 diabetes mellitus without complications: Secondary | ICD-10-CM

## 2024-03-09 DIAGNOSIS — Z7984 Long term (current) use of oral hypoglycemic drugs: Secondary | ICD-10-CM

## 2024-03-09 DIAGNOSIS — I10 Essential (primary) hypertension: Secondary | ICD-10-CM

## 2024-03-09 DIAGNOSIS — E785 Hyperlipidemia, unspecified: Secondary | ICD-10-CM

## 2024-03-09 DIAGNOSIS — E1169 Type 2 diabetes mellitus with other specified complication: Secondary | ICD-10-CM

## 2024-03-09 LAB — POCT GLYCOSYLATED HEMOGLOBIN (HGB A1C): HbA1c, POC (controlled diabetic range): 5.8 % (ref 0.0–7.0)

## 2024-03-09 MED ORDER — SPIRONOLACTONE 50 MG PO TABS
50.0000 mg | ORAL_TABLET | Freq: Every day | ORAL | 1 refills | Status: AC
Start: 2024-03-09 — End: ?
  Filled 2024-03-09 – 2024-05-05 (×3): qty 90, 90d supply, fill #0

## 2024-03-09 MED ORDER — DICLOFENAC SODIUM 1 % EX GEL
4.0000 g | Freq: Four times a day (QID) | CUTANEOUS | 1 refills | Status: AC
  Filled 2024-03-09: qty 100, 25d supply, fill #0
  Filled 2024-05-05: qty 100, 25d supply, fill #1

## 2024-03-09 MED ORDER — ATORVASTATIN CALCIUM 20 MG PO TABS
20.0000 mg | ORAL_TABLET | Freq: Every day | ORAL | 1 refills | Status: AC
  Filled 2024-03-09: qty 90, 90d supply, fill #0
  Filled 2024-05-05: qty 90, 90d supply, fill #1

## 2024-03-09 MED ORDER — LIDOCAINE 5 % EX PTCH
1.0000 | MEDICATED_PATCH | CUTANEOUS | 0 refills | Status: AC
  Filled 2024-03-09 – 2024-05-05 (×2): qty 30, 30d supply, fill #0

## 2024-03-09 MED ORDER — VALSARTAN-HYDROCHLOROTHIAZIDE 80-12.5 MG PO TABS
1.0000 | ORAL_TABLET | Freq: Every day | ORAL | 1 refills | Status: AC
Start: 2024-03-09 — End: ?
  Filled 2024-03-09 – 2024-05-05 (×3): qty 90, 90d supply, fill #0

## 2024-03-09 MED ORDER — CARVEDILOL 6.25 MG PO TABS
6.2500 mg | ORAL_TABLET | Freq: Two times a day (BID) | ORAL | 1 refills | Status: AC
Start: 2024-03-09 — End: ?
  Filled 2024-03-09 – 2024-05-05 (×3): qty 180, 90d supply, fill #0

## 2024-03-09 MED ORDER — METFORMIN HCL 500 MG PO TABS
500.0000 mg | ORAL_TABLET | Freq: Every day | ORAL | 1 refills | Status: AC
  Filled 2024-03-09 – 2024-05-05 (×3): qty 90, 90d supply, fill #0

## 2024-03-09 NOTE — Progress Notes (Signed)
 Subjective:  Patient ID: Alexandra Wu everitt Wu, female    DOB: 22-Dec-1970  Age: 53 y.o. MRN: [id number]  CC: Medical Management of Chronic Issues (Sore throat/Pain in chest for 1 month)     Discussed the use of AI scribe software for clinical note transcription with the patient, who gave verbal consent to proceed.  History of Present Illness Alexandra Wu is a 53 year old female with  a history of hypertension, 2 diabetes mellitus  who presents with concerns about blood pressure fluctuations.  She experiences significant fluctuations in blood pressure, with a morning reading of 100/59 mmHg and a pulse of 69 bpm, increasing to 140 mmHg after breakfast. She denies consuming salty foods and is on three antihypertensive medications in the morning. BP in the Clinic is elevated at 184/100.  Her diabetes is managed with metformin , and her recent A1c is 5.8, improved from 6.0, attributed to increased physical activity, specifically walking. She is on atorvastatin  for cholesterol but has not been taking it.  She has bilateral back pain that worsens at night and improves during the day, unrelieved by Tylenol , and does not radiate to her legs. She also experiences pain near her clavicle, described as a thorn-like sensation, exacerbated by breathing and pressure, with no recent trauma.    Past Medical History:  Diagnosis Date   Appendicitis, acute 04/23/2013   Lap appendectomy on 03/23/13   Dyslipidemia 03/24/2017   Hyperglycemia 02/17/2017   Hypertension    Nodular goiter 03/24/2017   Thyromegaly 02/17/2017    Past Surgical History:  Procedure Laterality Date   LAPAROSCOPIC APPENDECTOMY N/A 04/23/2013   Procedure: APPENDECTOMY LAPAROSCOPIC;  Surgeon: Deward GORMAN Curvin DOUGLAS, MD;  Location: [facility] OR;  Service: General;  Laterality: N/A;   TUBAL LIGATION  1997    Family History  Problem Relation Age of Onset   Arthritis Mother    Hypertension Mother    Arthritis Sister    Diabetes  Sister    Arthritis Sister    Heart disease Brother    Headache Daughter    Colon cancer Neg Hx    Esophageal cancer Neg Hx    Stomach cancer Neg Hx    Rectal cancer Neg Hx     Social History   Socioeconomic History   Marital status: Married    Spouse name: Personal assistant   Number of children: 3   Years of education: 1   Highest education level: Not on file  Occupational History   Occupation: Pensions consultant for offices   Occupation: not working  Tobacco Use   Smoking status: Never   Smokeless tobacco: Never  Vaping Use   Vaping status: Never Used  Substance and Sexual Activity   Alcohol use: No   Drug use: No   Sexual activity: Yes    Birth control/protection: Surgical  Other Topics Concern   Not on file  Social History Narrative   Originally [place]   Came [place] In [place]   Lives at home with husband   Social Drivers of Health   Financial Resource Strain: Low Risk  (10/29/2022)   Overall Financial Resource Strain (CARDIA)    Difficulty of Paying Living Expenses: Not very hard  Food Insecurity: No Food Insecurity (10/29/2022)   Hunger Vital Sign    Worried About Running Out of Food in the Last Year: Never true    Ran Out of Food in the Last Year: Never true  Transportation Needs: No Transportation Needs (10/29/2022)  PRAPARE - Administrator, Civil Service (Medical): No    Lack of Transportation (Non-Medical): No  Physical Activity: Insufficiently Active (10/29/2022)   Exercise Vital Sign    Days of Exercise per Week: 3 days    Minutes of Exercise per Session: 40 min  Stress: No Stress Concern Present (10/29/2022)   [employer] of Occupational Health - Occupational Stress Questionnaire    Feeling of Stress : Only a little  Social Connections: Socially Integrated (10/29/2022)   Social Connection and Isolation Panel    Frequency of Communication with Friends and Family: Three times a week    Frequency of Social Gatherings with Friends  and Family: Twice a week    Attends Religious Services: More than 4 times per year    Active Member of Clubs or Organizations: Yes    Attends Engineer, structural: More than 4 times per year    Marital Status: Married    No Known Allergies  Outpatient Medications Prior to Visit  Medication Sig Dispense Refill   Blood Glucose Monitoring Suppl (TRUE METRIX METER) DEVI 1 each by Does not apply route 3 (three) times daily before meals. 1 each 0   cetirizine  (ZYRTEC ) 10 MG tablet Take 1 tablet (10 mg total) by mouth daily. 90 tablet 1   fluticasone (FLONASE) 50 MCG/ACT nasal spray USE 1 SPRAY(S) IN EACH NOSTRIL TWICE DAILY AS NEEDED     glucose blood (TRUE METRIX BLOOD GLUCOSE TEST) test strip Use 3 times daily 100 each 12   Multiple Vitamins-Minerals (MULTIVITAMIN ADULT, MINERALS,) TABS multivitamin  i tab po qd     olopatadine (PATANOL) 0.1 % ophthalmic solution INSTILL 1 DROP INTO AFFECTED EYE(S) BY OPHTHALMIC ROUTE 2 TIMES PER DAY AT AN INTERVAL OF 6 TO 8 HOURS     Omega-3 Fatty Acids (OMEGA-3 FISH OIL PO) Take by mouth daily.     pantoprazole  (PROTONIX ) 40 MG tablet Take 1 tablet (40 mg total) by mouth 2 (two) times daily. 180 tablet 3   tiZANidine  (ZANAFLEX ) 4 MG tablet Take 1 tablet (4 mg total) by mouth every 8 (eight) hours as needed for muscle spasms. 90 tablet 1   TRUEplus Lancets 28G MISC 1 each by Does not apply route 3 (three) times daily before meals. 100 each 12   atorvastatin  (LIPITOR) 20 MG tablet Take 1 tablet (20 mg total) by mouth daily. 90 tablet 1   carvedilol  (COREG ) 6.25 MG tablet Take 1 tablet (6.25 mg total) by mouth 2 (two) times daily with a meal. 180 tablet 1   metFORMIN  (GLUCOPHAGE ) 500 MG tablet Take 1 tablet (500 mg total) by mouth daily with breakfast. 90 tablet 1   spironolactone  (ALDACTONE ) 50 MG tablet Take 1 tablet (50 mg total) by mouth daily. 90 tablet 1   valsartan -hydrochlorothiazide  (DIOVAN -HCT) 80-12.5 MG tablet Take 1 tablet by mouth daily.  90 tablet 1   No facility-administered medications prior to visit.     ROS Review of Systems  Constitutional:  Negative for activity change and appetite change.  HENT:  Negative for sinus pressure and sore throat.   Respiratory:  Negative for chest tightness, shortness of breath and wheezing.   Cardiovascular:  Positive for chest pain. Negative for palpitations.  Gastrointestinal:  Negative for abdominal distention, abdominal pain and constipation.  Genitourinary: Negative.   Musculoskeletal:  Positive for back pain.  Psychiatric/Behavioral:  Negative for behavioral problems and dysphoric mood.     Objective:  BP (!) 171/80   Pulse ROLLEN)  57   Ht 5' (1.524 m)   Wt 165 lb 9.6 oz (75.1 kg)   LMP 09/01/2018   SpO2 100%   BMI 32.34 kg/m      03/09/2024    4:45 PM 03/09/2024    4:28 PM 09/09/2023    4:48 PM  BP/Weight  Systolic BP 171 184 149  Diastolic BP 80 100 91  Wt. (Lbs)  165.6   BMI  32.34 kg/m2       Physical Exam Constitutional:      Appearance: She is well-developed.  HENT:     Mouth/Throat:     Mouth: Mucous membranes are moist.  Cardiovascular:     Rate and Rhythm: Normal rate.     Heart sounds: Normal heart sounds. No murmur heard. Pulmonary:     Effort: Pulmonary effort is normal.     Breath sounds: Normal breath sounds. No wheezing or rales.  Chest:     Chest wall: Tenderness (L upper chestwall at region between clavicle and first rib) present.  Abdominal:     General: Bowel sounds are normal. There is no distension.     Palpations: Abdomen is soft. There is no mass.     Tenderness: There is no abdominal tenderness.  Musculoskeletal:        General: Normal range of motion.     Right lower leg: No edema.     Left lower leg: No edema.     Comments: No left spine TTP Negative straight leg raise bilaterally  Neurological:     Mental Status: She is alert and oriented to person, place, and time.  Psychiatric:        Mood and Affect: Mood normal.     Diabetic Foot Exam - Simple   Simple Foot Form Diabetic Foot exam was performed with the following findings: Yes 03/09/2024  4:35 PM  Visual Inspection No deformities, no ulcerations, no other skin breakdown bilaterally: Yes Sensation Testing Intact to touch and monofilament testing bilaterally: Yes Pulse Check Posterior Tibialis and Dorsalis pulse intact bilaterally: Yes Comments        Latest Ref Rng & Units 09/09/2023    5:01 PM 01/30/2023    8:57 AM 05/06/2022    9:33 AM  CMP  Glucose 70 - 99 mg/dL 885  896  890   BUN 6 - 24 mg/dL 11  14  16    Creatinine 0.57 - 1.00 mg/dL 9.31  9.29  9.27   Sodium 134 - 144 mmol/L 140  140  141   Potassium 3.5 - 5.2 mmol/L 4.8  4.3  4.4   Chloride 96 - 106 mmol/L 99  105  101   CO2 20 - 29 mmol/L 26  22  24    Calcium  8.7 - 10.2 mg/dL 89.6  9.0  89.8   Total Protein 6.0 - 8.5 g/dL 8.0  6.9  7.9   Total Bilirubin 0.0 - 1.2 mg/dL <9.7  0.2  0.3   Alkaline Phos 44 - 121 IU/L 113  82  115   AST 0 - 40 IU/L 23  17  23    ALT 0 - 32 IU/L 21  14  18      Lipid Panel     Component Value Date/Time   CHOL 193 01/30/2023 0857   TRIG 150 (H) 01/30/2023 0857   HDL 47 01/30/2023 0857   CHOLHDL 4.1 Ratio 06/22/2007 0000   VLDL 38 06/22/2007 0000   LDLCALC 119 (H) 01/30/2023 0857    CBC  Component Value Date/Time   WBC 9.7 01/30/2023 0857   WBC 11.9 (H) 09/21/2019 1804   RBC 4.40 01/30/2023 0857   RBC 4.89 09/21/2019 1804   HGB 12.4 01/30/2023 0857   HCT 38.7 01/30/2023 0857   PLT 259 01/30/2023 0857   MCV 88 01/30/2023 0857   MCH 28.2 01/30/2023 0857   MCH 28.6 09/21/2019 1804   MCHC 32.0 01/30/2023 0857   MCHC 32.5 09/21/2019 1804   RDW 13.9 01/30/2023 0857   LYMPHSABS 3.8 (H) 01/30/2023 0857   MONOABS 0.7 04/22/2013 1919   EOSABS 0.1 01/30/2023 0857   BASOSABS 0.1 01/30/2023 0857    Lab Results  Component Value Date   HGBA1C 5.8 03/09/2024    Lab Results  Component Value Date   HGBA1C 5.8 03/09/2024   HGBA1C 6.0  09/09/2023   HGBA1C 6.5 (H) 01/30/2023       Assessment & Plan Hypertension Blood pressure poorly controlled with significant variability. Possible faulty home monitor. - Recheck blood pressure in office is still elevated at 171/80 - Refill all antihypertensive medications. - Instruct her to bring home blood pressure monitor to next visit for comparison. Counseled on blood pressure goal of less than 130/80, low-sodium, DASH diet, medication compliance, 150 minutes of moderate intensity exercise per week. Discussed medication compliance, adverse effects.   Type 2 diabetes mellitus A1c improved to 5.8%, indicating better glycemic control with metformin  and regular walking. Continue Metformin   Hyperlipidemia Not taking atorvastatin  despite increased cardiovascular risk due to diabetes. Walking alone insufficient for cholesterol management. - Educate on the importance of taking atorvastatin  to reduce the risk of heart attack and stroke. - Encourage continuation of regular walking for overall health.  Back pain Chronic back pain, more severe at night. Tylenol  ineffective. - Prescribe lidoderm  patches for pain relief. - Recommend use of a heating pad on the back. - Advise on back exercises and stretching, accessible via phone.  Costochondritis Pain in ribcage area consistent with costochondritis. - Prescribe anti-inflammatory cream for use on chest wall and back.      Meds ordered this encounter  Medications   lidocaine  (LIDODERM ) 5 %    Sig: Place 1 patch onto the skin daily. Remove & Discard patch within 12 hours or as directed by MD    Dispense:  30 patch    Refill:  0   diclofenac  Sodium (VOLTAREN ) 1 % GEL    Sig: Apply 4 g topically 4 (four) times daily.    Dispense:  100 g    Refill:  1   atorvastatin  (LIPITOR) 20 MG tablet    Sig: Take 1 tablet (20 mg total) by mouth daily.    Dispense:  90 tablet    Refill:  1   carvedilol  (COREG ) 6.25 MG tablet    Sig: Take 1  tablet (6.25 mg total) by mouth 2 (two) times daily with a meal.    Dispense:  180 tablet    Refill:  1   metFORMIN  (GLUCOPHAGE ) 500 MG tablet    Sig: Take 1 tablet (500 mg total) by mouth daily with breakfast.    Dispense:  90 tablet    Refill:  1   spironolactone  (ALDACTONE ) 50 MG tablet    Sig: Take 1 tablet (50 mg total) by mouth daily.    Dispense:  90 tablet    Refill:  1   valsartan -hydrochlorothiazide  (DIOVAN -HCT) 80-12.5 MG tablet    Sig: Take 1 tablet by mouth daily.    Dispense:  90 tablet  Refill:  1    Follow-up: Return in about 1 month (around 04/09/2024) for Chronic medical conditions, Blood Pressure follow-up with PCP.       Corrina Sabin, MD, FAAFP. [facility] [place], [place] [phone]   03/09/2024, 5:25 PM

## 2024-03-09 NOTE — Patient Instructions (Signed)
Cmo controlar su hipertensin Managing Your Hypertension La hipertensin, tambin conocida como presin arterial alta, se produce cuando la sangre ejerce presin contra las paredes de las arterias con demasiada fuerza. Las arterias son los vasos sanguneos que transportan la sangre desde el corazn hacia todas las partes del cuerpo. La hipertensin hace que el corazn haga ms esfuerzo para bombear la sangre y puede provocar que las arterias se estrechen o endurezcan. Qu significan las lecturas de la presin arterial Una lectura de la presin arterial consta de un nmero ms alto sobre un nmero ms bajo. El primer nmero, o nmero superior, es la presin sistlica. Es la medida de la presin de las arterias cuando el corazn late. El segundo nmero, o nmero inferior, es la presin diastlica. Es la medida de la presin en las arterias cuando el corazn se relaja. Para la mayora de las personas, una presin arterial normal est por debajo de 120/80. La presin arterial deseada puede variar en funcin de las enfermedades, la edad y otros factores personales. La presin arterial se clasifica en cuatro etapas. Sobre la base de la lectura de su presin arterial, el mdico puede usar las siguientes etapas para determinar si necesita tratamiento y de qu tipo. La presin sistlica y la presin diastlica se miden en una unidad llamada milmetros de mercurio (mm Hg). Normal Presin sistlica: por debajo de 120. Presin diastlica: por debajo de 80. Elevada Presin sistlica: 120-129. Presin diastlica: por debajo de 80. Etapa 1 de hipertensin Presin sistlica: 130-139. Presin diastlica: 80-89. Etapa 2 de hipertensin Presin sistlica: 140 o ms. Presin diastlica: 90 o ms. Cmo puede afectarme esta enfermedad? Controlar la hipertensin es muy importante. Con el transcurso del tiempo, la hipertensin puede daar las arterias y disminuir el flujo de sangre hacia partes del cuerpo que  incluyen el cerebro, el corazn y los riones. Tener hipertensin no tratada o no controlada puede causar: Infarto de miocardio. Accidente cerebrovascular. Debilitamiento de los vasos sanguneos (aneurisma). Insuficiencia cardaca. Dao renal. Dao ocular. Problemas de memoria y concentracin. Demencia vascular. Qu medidas puedo tomar para controlar esta afeccin? La hipertensin se puede controlar haciendo cambios en el estilo de vida y, posiblemente, tomando medicamentos. Su mdico le ayudar a crear un plan para bajar la presin arterial al rango normal. Es posible que lo deriven para que reciba asesoramiento sobre una dieta saludable y actividad fsica. Nutricin  Siga una dieta con alto contenido de fibras y potasio, y con bajo contenido de sal (sodio), azcar agregada y grasas. Un ejemplo de plan de alimentacin se denomina dieta DASH. DASH es la sigla en ingls de "Enfoques alimentarios para detener la hipertensin". Para alimentarse de esta manera: Coma mucha fruta y verdura fresca. Trate de que la mitad del plato de cada comida sea de frutas y verduras. Coma cereales integrales, como pasta integral, arroz integral o pan integral. Llene aproximadamente un cuarto del plato con cereales integrales. Consuma productos lcteos descremados. Evite la ingesta de cortes de carne grasa, carne procesada o curada, y carne de ave con piel. Llene aproximadamente un cuarto del plato con protenas magras, como pescado, pollo sin piel, frijoles, huevos o tofu. Evite ingerir alimentos prehechos y procesados. En general, estos tienen mayor cantidad de sodio, azcar agregada y grasa. Reduzca su ingesta diaria de sodio. Muchas personas que tienen hipertensin deben comer menos de 1500 mg de sodio por da. Estilo de vida  Trabaje con su mdico para mantener un peso saludable o perder peso. Pregntele cul es el peso recomendado   para usted. Realice al menos 30 minutos de ejercicio que haga que se acelere  su corazn (ejercicio aerbico) la mayora de los das de la semana. Estas actividades pueden incluir caminar, nadar o andar en bicicleta. Incluya ejercicios para fortalecer sus msculos (ejercicios de resistencia), como levantamiento de pesas, como parte de su rutina semanal de ejercicios. Intente realizar 30 minutos de este tipo de ejercicios al menos tres das a la semana. No consuma ningn producto que contenga nicotina o tabaco. Estos productos incluyen cigarrillos, tabaco para mascar y aparatos de vapeo, como los cigarrillos electrnicos. Si necesita ayuda para dejar de consumir estos productos, consulte al mdico. Controle las enfermedades a largo plazo (crnicas), como el colesterol alto o la diabetes. Identifique sus causas de estrs y encuentre maneras de controlar el estrs. Esto puede incluir meditacin, respiracin profunda o hacerse tiempo para realizar actividades divertidas. Consumo de alcohol No beba alcohol si: Su mdico le indica no hacerlo. Est embarazada, puede estar embarazada o est tratando de quedar embarazada. Si bebe alcohol: Limite la cantidad que bebe a lo siguiente: De 0 a 1 medida por da para las mujeres. De 0 a 2 medidas por da para los hombres. Sepa cunta cantidad de alcohol hay en las bebidas que toma. En los [place], una medida equivale a una botella de cerveza de 12 oz (355 ml), un vaso de vino de 5 oz (148 ml) o un vaso de una bebida alcohlica de alta graduacin de 1 oz (44 ml). Medicamentos El mdico puede recetarle medicamentos si los cambios en el estilo de vida no son suficientes para lograr controlar la presin arterial y si: Su presin arterial sistlica es de 130 o ms. Su presin arterial diastlica es de 80 o ms. Use los medicamentos solamente como se lo haya indicado el mdico. Siga cuidadosamente las indicaciones. Los medicamentos para la presin arterial deben tomarse como se lo haya indicado el mdico. Los medicamentos pierden eficacia  al omitir las dosis. El hecho de omitir las dosis tambin aumenta el riesgo de otros problemas. Monitoreo Antes de controlarse la presin arterial: No fume, no consuma bebidas con cafena ni haga ejercicio dentro de los 30 minutos antes de tomar la medicin. Vaya al bao y vace la vejiga (orine). Permanezca sentado tranquilamente durante al menos 5 minutos antes de tomar las mediciones. Contrlese la presin arterial en su casa segn las indicaciones del mdico. Para hacer esto: Sintese con la espalda recta y con apoyo. Coloque los pies planos en el piso. No se cruce de piernas. Apoye el brazo sobre una superficie plana, como una mesa. Asegrese de que la parte superior del brazo est al nivel del corazn. Cada vez que tome una medicin, tome dos o tres lecturas con un minuto de separacin y anote los resultados. Posiblemente tambin sea necesario que el mdico le controle la presin arterial de manera regular. Informacin general Hable con su mdico acerca de la dieta, hbitos de ejercicio y otros factores del estilo de vida que pueden contribuir a la hipertensin. Revise con su mdico todos los medicamentos que toma ya que puede haber efectos secundarios o interacciones. Concurra a todas las visitas de seguimiento. El mdico puede ayudarle a crear y ajustar su plan para controlar la presin arterial alta. Dnde obtener ms informacin National Heart, Lung, and Blood Institute (Instituto Nacional del Corazn, los Pulmones y la Sangre): www.nhlbi.nih.gov American Heart Association (Asociacin Estadounidense del Corazn): www.heart.org Comunquese con un mdico si: Piensa que tiene una reaccin alrgica a los medicamentos   que ha tomado. Tiene dolores de cabeza frecuentes (recurrentes). Siente mareos. Tiene hinchazn en los tobillos. Tiene problemas de visin. Solicite ayuda de inmediato si: Siente un dolor de cabeza intenso o confusin. Siente debilidad inusual, adormecimiento o que se  desmayar. Siente un dolor intenso en el pecho o el abdomen. Vomita repetidas veces. Tiene dificultad para respirar. Estos sntomas pueden indicar una emergencia. Solicite ayuda de inmediato. Llame al 911. No espere a ver si los sntomas desaparecen. No conduzca por sus propios medios hasta el hospital. Resumen La hipertensin se produce cuando la sangre bombea en las arterias con mucha fuerza. Si esta afeccin no se controla, podra correr riesgo de tener complicaciones graves. La presin arterial deseada puede variar en funcin de las enfermedades, la edad y otros factores personales. Para la mayora de las personas, una presin arterial normal es menor que 120/80. La hipertensin se puede controlar mediante cambios en el estilo de vida, tomando medicamentos, o ambas cosas. Los cambios en el estilo de vida para ayudar a controlar la hipertensin incluyen prdida de peso, seguir una dieta saludable, con bajo contenido de sodio, hacer ms ejercicio, dejar de fumar y limitar el consumo de alcohol. Esta informacin no tiene como fin reemplazar el consejo del mdico. Asegrese de hacerle al mdico cualquier pregunta que tenga. Document Revised: 04/22/2021 Document Reviewed: 04/22/2021 Elsevier Patient Education  2024 [employer].  

## 2024-03-10 ENCOUNTER — Other Ambulatory Visit: Payer: Self-pay

## 2024-03-10 ENCOUNTER — Ambulatory Visit: Payer: Self-pay | Admitting: Family Medicine

## 2024-03-10 ENCOUNTER — Other Ambulatory Visit: Payer: Self-pay | Admitting: Urology

## 2024-03-10 LAB — BASIC METABOLIC PANEL WITH GFR
BUN/Creatinine Ratio: 16 (ref 9–23)
BUN: 10 mg/dL (ref 6–24)
CO2: 22 mmol/L (ref 20–29)
Calcium: 10.2 mg/dL (ref 8.7–10.2)
Chloride: 100 mmol/L (ref 96–106)
Creatinine, Ser: 0.61 mg/dL (ref 0.57–1.00)
Glucose: 98 mg/dL (ref 70–99)
Potassium: 4.4 mmol/L (ref 3.5–5.2)
Sodium: 140 mmol/L (ref 134–144)
eGFR: 106 mL/min/1.73 (ref >59)

## 2024-03-10 MED ORDER — SOLIFENACIN SUCCINATE 5 MG PO TABS
5.0000 mg | ORAL_TABLET | Freq: Every day | ORAL | 0 refills | Status: AC
  Filled 2024-03-10: qty 30, 30d supply, fill #0

## 2024-03-11 ENCOUNTER — Other Ambulatory Visit: Payer: Self-pay

## 2024-04-12 ENCOUNTER — Other Ambulatory Visit: Payer: Self-pay

## 2024-04-12 ENCOUNTER — Encounter: Payer: Self-pay | Admitting: Family Medicine

## 2024-04-12 ENCOUNTER — Ambulatory Visit: Payer: Self-pay | Attending: Family Medicine | Admitting: Family Medicine

## 2024-04-12 VITALS — BP 145/85 | HR 62 | Ht 60.0 in | Wt 164.8 lb

## 2024-04-12 DIAGNOSIS — I10 Essential (primary) hypertension: Secondary | ICD-10-CM

## 2024-04-12 DIAGNOSIS — Z7984 Long term (current) use of oral hypoglycemic drugs: Secondary | ICD-10-CM

## 2024-04-12 DIAGNOSIS — H1013 Acute atopic conjunctivitis, bilateral: Secondary | ICD-10-CM

## 2024-04-12 DIAGNOSIS — Z79899 Other long term (current) drug therapy: Secondary | ICD-10-CM

## 2024-04-12 DIAGNOSIS — E1169 Type 2 diabetes mellitus with other specified complication: Secondary | ICD-10-CM

## 2024-04-12 MED ORDER — OLOPATADINE HCL 0.1 % OP SOLN
1.0000 [drp] | Freq: Every day | OPHTHALMIC | 2 refills | Status: AC
  Filled 2024-04-12: qty 5, 50d supply, fill #0
  Filled 2024-05-05: qty 5, 50d supply, fill #1

## 2024-04-12 NOTE — Progress Notes (Signed)
 Subjective:  Patient ID: Alexandra Wu, female    DOB: Oct 06, 1970  Age: 53 y.o. MRN: [id number]  CC: Hypertension (Eyes itching in the morning)     Discussed the use of AI scribe software for clinical note transcription with the patient, who gave verbal consent to proceed.  History of Present Illness Alexandra Wu is a 53 year old female with hypertension, Type 2 DM who presents with elevated blood pressure readings.  At home, her blood pressure reading was 113/159, while in the clinic, it was recorded as 170/90 with her home blood pressure monitor and later as 160/90 with a manual cuff. She is on carvedilol , one tablet twice daily, along with spironolactone  and valsartan -hydrochlorothiazide .  She experiences ocular symptoms including watering, tearing, and itching, particularly upon waking. Her eyes feel dirty and sticky with some itching, and these symptoms have persisted for about a month with intermittent relief. Redness and a sensation of grit or sand in her eyes are also present upon waking. She works in Dietitian for offices. No fever or other systemic symptoms.    Past Medical History:  Diagnosis Date   Appendicitis, acute 04/23/2013   Lap appendectomy on 03/23/13   Dyslipidemia 03/24/2017   Hyperglycemia 02/17/2017   Hypertension    Nodular goiter 03/24/2017   Thyromegaly 02/17/2017    Past Surgical History:  Procedure Laterality Date   LAPAROSCOPIC APPENDECTOMY N/A 04/23/2013   Procedure: APPENDECTOMY LAPAROSCOPIC;  Surgeon: Deward GORMAN Curvin DOUGLAS, MD;  Location: [facility] OR;  Service: General;  Laterality: N/A;   TUBAL LIGATION  1997    Family History  Problem Relation Age of Onset   Arthritis Mother    Hypertension Mother    Arthritis Sister    Diabetes Sister    Arthritis Sister    Heart disease Brother    Headache Daughter    Colon cancer Neg Hx    Esophageal cancer Neg Hx    Stomach cancer Neg Hx    Rectal cancer Neg Hx     Social  History   Socioeconomic History   Marital status: Married    Spouse name: Personal assistant   Number of children: 3   Years of education: 1   Highest education level: Not on file  Occupational History   Occupation: Pensions consultant for offices   Occupation: not working  Tobacco Use   Smoking status: Never   Smokeless tobacco: Never  Vaping Use   Vaping status: Never Used  Substance and Sexual Activity   Alcohol use: No   Drug use: No   Sexual activity: Yes    Birth control/protection: Surgical  Other Topics Concern   Not on file  Social History Narrative   Originally [place]   Came [place] In [place]   Lives at home with husband   Social Drivers of Health   Financial Resource Strain: Low Risk  (10/29/2022)   Overall Financial Resource Strain (CARDIA)    Difficulty of Paying Living Expenses: Not very hard  Food Insecurity: No Food Insecurity (10/29/2022)   Hunger Vital Sign    Worried About Running Out of Food in the Last Year: Never true    Ran Out of Food in the Last Year: Never true  Transportation Needs: No Transportation Needs (10/29/2022)   PRAPARE - Administrator, Civil Service (Medical): No    Lack of Transportation (Non-Medical): No  Physical Activity: Insufficiently Active (10/29/2022)   Exercise Vital Sign  Days of Exercise per Week: 3 days    Minutes of Exercise per Session: 40 min  Stress: No Stress Concern Present (10/29/2022)   [employer] of Occupational Health - Occupational Stress Questionnaire    Feeling of Stress : Only a little  Social Connections: Socially Integrated (10/29/2022)   Social Connection and Isolation Panel    Frequency of Communication with Friends and Family: Three times a week    Frequency of Social Gatherings with Friends and Family: Twice a week    Attends Religious Services: More than 4 times per year    Active Member of [employer] or Organizations: Yes    Attends Engineer, structural: More than 4 times  per year    Marital Status: Married    No Known Allergies  Outpatient Medications Prior to Visit  Medication Sig Dispense Refill   atorvastatin  (LIPITOR) 20 MG tablet Take 1 tablet (20 mg total) by mouth daily. 90 tablet 1   Blood Glucose Monitoring Suppl (TRUE METRIX METER) DEVI 1 each by Does not apply route 3 (three) times daily before meals. 1 each 0   carvedilol  (COREG ) 6.25 MG tablet Take 1 tablet (6.25 mg total) by mouth 2 (two) times daily with a meal. 180 tablet 1   cetirizine  (ZYRTEC ) 10 MG tablet Take 1 tablet (10 mg total) by mouth daily. 90 tablet 1   diclofenac  Sodium (VOLTAREN ) 1 % GEL Apply 4 g topically 4 (four) times daily. 100 g 1   fluticasone (FLONASE) 50 MCG/ACT nasal spray USE 1 SPRAY(S) IN EACH NOSTRIL TWICE DAILY AS NEEDED     glucose blood (TRUE METRIX BLOOD GLUCOSE TEST) test strip Use 3 times daily 100 each 12   lidocaine  (LIDODERM ) 5 % Place 1 patch onto the skin daily. Remove & Discard patch within 12 hours or as directed by MD 30 patch 0   metFORMIN  (GLUCOPHAGE ) 500 MG tablet Take 1 tablet (500 mg total) by mouth daily with breakfast. 90 tablet 1   Multiple Vitamins-Minerals (MULTIVITAMIN ADULT, MINERALS,) TABS multivitamin  i tab po qd     Omega-3 Fatty Acids (OMEGA-3 FISH OIL PO) Take by mouth daily.     pantoprazole  (PROTONIX ) 40 MG tablet Take 1 tablet (40 mg total) by mouth 2 (two) times daily. 180 tablet 3   spironolactone  (ALDACTONE ) 50 MG tablet Take 1 tablet (50 mg total) by mouth daily. 90 tablet 1   tiZANidine  (ZANAFLEX ) 4 MG tablet Take 1 tablet (4 mg total) by mouth every 8 (eight) hours as needed for muscle spasms. 90 tablet 1   TRUEplus Lancets 28G MISC 1 each by Does not apply route 3 (three) times daily before meals. 100 each 12   valsartan -hydrochlorothiazide  (DIOVAN -HCT) 80-12.5 MG tablet Take 1 tablet by mouth daily. 90 tablet 1   olopatadine  (PATANOL) 0.1 % ophthalmic solution INSTILL 1 DROP INTO AFFECTED EYE(S) BY OPHTHALMIC ROUTE 2 TIMES  PER DAY AT AN INTERVAL OF 6 TO 8 HOURS     No facility-administered medications prior to visit.     ROS Review of Systems  Constitutional:  Negative for activity change and appetite change.  HENT:  Negative for sinus pressure and sore throat.   Eyes:  Positive for redness and itching. Negative for photophobia and visual disturbance.  Respiratory:  Negative for chest tightness, shortness of breath and wheezing.   Cardiovascular:  Negative for chest pain and palpitations.  Gastrointestinal:  Negative for abdominal distention, abdominal pain and constipation.  Genitourinary: Negative.   Musculoskeletal:  Negative.   Psychiatric/Behavioral:  Negative for behavioral problems and dysphoric mood.     Objective:  BP (!) 145/85   Pulse 62   Ht 5' (1.524 m)   Wt 164 lb 12.8 oz (74.8 kg)   LMP 09/01/2018   SpO2 100%   BMI 32.19 kg/m      04/12/2024    4:17 PM 04/12/2024    3:47 PM 03/09/2024    4:45 PM  BP/Weight  Systolic BP 145 157 171  Diastolic BP 85 89 80  Wt. (Lbs)  164.8   BMI  32.19 kg/m2       Physical Exam Constitutional:      Appearance: She is well-developed.  Eyes:     Extraocular Movements: Extraocular movements intact.     Conjunctiva/sclera: Conjunctivae normal.     Pupils: Pupils are equal, round, and reactive to light.  Cardiovascular:     Rate and Rhythm: Normal rate.     Heart sounds: Normal heart sounds. No murmur heard. Pulmonary:     Effort: Pulmonary effort is normal.     Breath sounds: Normal breath sounds. No wheezing or rales.  Chest:     Chest wall: No tenderness.  Abdominal:     General: Bowel sounds are normal. There is no distension.     Palpations: Abdomen is soft. There is no mass.     Tenderness: There is no abdominal tenderness.  Musculoskeletal:        General: Normal range of motion.     Right lower leg: No edema.     Left lower leg: No edema.  Neurological:     Mental Status: She is alert and oriented to person, place, and  time.  Psychiatric:        Mood and Affect: Mood normal.        Latest Ref Rng & Units 03/09/2024    4:52 PM 09/09/2023    5:01 PM 01/30/2023    8:57 AM  CMP  Glucose 70 - 99 mg/dL 98  885  896   BUN 6 - 24 mg/dL 10  11  14    Creatinine 0.57 - 1.00 mg/dL 9.38  9.31  9.29   Sodium 134 - 144 mmol/L 140  140  140   Potassium 3.5 - 5.2 mmol/L 4.4  4.8  4.3   Chloride 96 - 106 mmol/L 100  99  105   CO2 20 - 29 mmol/L 22  26  22    Calcium  8.7 - 10.2 mg/dL 89.7  89.6  9.0   Total Protein 6.0 - 8.5 g/dL  8.0  6.9   Total Bilirubin 0.0 - 1.2 mg/dL  <9.7  0.2   Alkaline Phos 44 - 121 IU/L  113  82   AST 0 - 40 IU/L  23  17   ALT 0 - 32 IU/L  21  14     Lipid Panel     Component Value Date/Time   CHOL 193 01/30/2023 0857   TRIG 150 (H) 01/30/2023 0857   HDL 47 01/30/2023 0857   CHOLHDL 4.1 Ratio 06/22/2007 0000   VLDL 38 06/22/2007 0000   LDLCALC 119 (H) 01/30/2023 0857    CBC    Component Value Date/Time   WBC 9.7 01/30/2023 0857   WBC 11.9 (H) 09/21/2019 1804   RBC 4.40 01/30/2023 0857   RBC 4.89 09/21/2019 1804   HGB 12.4 01/30/2023 0857   HCT 38.7 01/30/2023 0857   PLT 259 01/30/2023 0857  MCV 88 01/30/2023 0857   MCH 28.2 01/30/2023 0857   MCH 28.6 09/21/2019 1804   MCHC 32.0 01/30/2023 0857   MCHC 32.5 09/21/2019 1804   RDW 13.9 01/30/2023 0857   LYMPHSABS 3.8 (H) 01/30/2023 0857   MONOABS 0.7 04/22/2013 1919   EOSABS 0.1 01/30/2023 0857   BASOSABS 0.1 01/30/2023 0857    Lab Results  Component Value Date   HGBA1C 5.8 03/09/2024       Assessment & Plan Allergic conjunctivitis - Prescribed eye drops for symptom relief.  Hypertension Clinic blood pressure higher than home readings. I reviewed her home blood pressure monitor and her readings have always been less than 130/80. -Repeat blood pressure performed by nursing staff returned at 145/85 - Continue carvedilol , spironolactone , valsartan -hydrochlorothiazide . - Instructed on proper home blood  pressure monitoring technique. - Recheck blood pressure in clinic in three months. -Counseled on blood pressure goal of less than 130/80, low-sodium, DASH diet, medication compliance, 150 minutes of moderate intensity exercise per week. Discussed medication compliance, adverse effects.        Meds ordered this encounter  Medications   olopatadine  (PATANOL) 0.1 % ophthalmic solution    Sig: Place 1 drop into both eyes daily.    Dispense:  5 mL    Refill:  2    Follow-up: Return in about 3 months (around 07/12/2024).       Corrina Sabin, MD, FAAFP. [facility] [place], [place] [phone]   04/12/2024, 5:05 PM

## 2024-04-12 NOTE — Patient Instructions (Signed)
Cmo controlar su hipertensin Managing Your Hypertension La hipertensin, tambin conocida como presin arterial alta, se produce cuando la sangre ejerce presin contra las paredes de las arterias con demasiada fuerza. Las arterias son los vasos sanguneos que transportan la sangre desde el corazn hacia todas las partes del cuerpo. La hipertensin hace que el corazn haga ms esfuerzo para bombear la sangre y puede provocar que las arterias se estrechen o endurezcan. Qu significan las lecturas de la presin arterial Una lectura de la presin arterial consta de un nmero ms alto sobre un nmero ms bajo. El primer nmero, o nmero superior, es la presin sistlica. Es la medida de la presin de las arterias cuando el corazn late. El segundo nmero, o nmero inferior, es la presin diastlica. Es la medida de la presin en las arterias cuando el corazn se relaja. Para la mayora de las personas, una presin arterial normal est por debajo de 120/80. La presin arterial deseada puede variar en funcin de las enfermedades, la edad y otros factores personales. La presin arterial se clasifica en cuatro etapas. Sobre la base de la lectura de su presin arterial, el mdico puede usar las siguientes etapas para determinar si necesita tratamiento y de qu tipo. La presin sistlica y la presin diastlica se miden en una unidad llamada milmetros de mercurio (mm Hg). Normal Presin sistlica: por debajo de 120. Presin diastlica: por debajo de 80. Elevada Presin sistlica: 120-129. Presin diastlica: por debajo de 80. Etapa 1 de hipertensin Presin sistlica: 130-139. Presin diastlica: 80-89. Etapa 2 de hipertensin Presin sistlica: 140 o ms. Presin diastlica: 90 o ms. Cmo puede afectarme esta enfermedad? Controlar la hipertensin es muy importante. Con el transcurso del tiempo, la hipertensin puede daar las arterias y disminuir el flujo de sangre hacia partes del cuerpo que  incluyen el cerebro, el corazn y los riones. Tener hipertensin no tratada o no controlada puede causar: Infarto de miocardio. Accidente cerebrovascular. Debilitamiento de los vasos sanguneos (aneurisma). Insuficiencia cardaca. Dao renal. Dao ocular. Problemas de memoria y concentracin. Demencia vascular. Qu medidas puedo tomar para controlar esta afeccin? La hipertensin se puede controlar haciendo cambios en el estilo de vida y, posiblemente, tomando medicamentos. Su mdico le ayudar a crear un plan para bajar la presin arterial al rango normal. Es posible que lo deriven para que reciba asesoramiento sobre una dieta saludable y actividad fsica. Nutricin  Siga una dieta con alto contenido de fibras y potasio, y con bajo contenido de sal (sodio), azcar agregada y grasas. Un ejemplo de plan de alimentacin se denomina dieta DASH. DASH es la sigla en ingls de "Enfoques alimentarios para detener la hipertensin". Para alimentarse de esta manera: Coma mucha fruta y verdura fresca. Trate de que la mitad del plato de cada comida sea de frutas y verduras. Coma cereales integrales, como pasta integral, arroz integral o pan integral. Llene aproximadamente un cuarto del plato con cereales integrales. Consuma productos lcteos descremados. Evite la ingesta de cortes de carne grasa, carne procesada o curada, y carne de ave con piel. Llene aproximadamente un cuarto del plato con protenas magras, como pescado, pollo sin piel, frijoles, huevos o tofu. Evite ingerir alimentos prehechos y procesados. En general, estos tienen mayor cantidad de sodio, azcar agregada y grasa. Reduzca su ingesta diaria de sodio. Muchas personas que tienen hipertensin deben comer menos de 1500 mg de sodio por da. Estilo de vida  Trabaje con su mdico para mantener un peso saludable o perder peso. Pregntele cul es el peso recomendado   para usted. Realice al menos 30 minutos de ejercicio que haga que se acelere  su corazn (ejercicio aerbico) la mayora de los das de la semana. Estas actividades pueden incluir caminar, nadar o andar en bicicleta. Incluya ejercicios para fortalecer sus msculos (ejercicios de resistencia), como levantamiento de pesas, como parte de su rutina semanal de ejercicios. Intente realizar 30 minutos de este tipo de ejercicios al menos tres das a la semana. No consuma ningn producto que contenga nicotina o tabaco. Estos productos incluyen cigarrillos, tabaco para mascar y aparatos de vapeo, como los cigarrillos electrnicos. Si necesita ayuda para dejar de consumir estos productos, consulte al mdico. Controle las enfermedades a largo plazo (crnicas), como el colesterol alto o la diabetes. Identifique sus causas de estrs y encuentre maneras de controlar el estrs. Esto puede incluir meditacin, respiracin profunda o hacerse tiempo para realizar actividades divertidas. Consumo de alcohol No beba alcohol si: Su mdico le indica no hacerlo. Est embarazada, puede estar embarazada o est tratando de quedar embarazada. Si bebe alcohol: Limite la cantidad que bebe a lo siguiente: De 0 a 1 medida por da para las mujeres. De 0 a 2 medidas por da para los hombres. Sepa cunta cantidad de alcohol hay en las bebidas que toma. En los [place], una medida equivale a una botella de cerveza de 12 oz (355 ml), un vaso de vino de 5 oz (148 ml) o un vaso de una bebida alcohlica de alta graduacin de 1 oz (44 ml). Medicamentos El mdico puede recetarle medicamentos si los cambios en el estilo de vida no son suficientes para lograr controlar la presin arterial y si: Su presin arterial sistlica es de 130 o ms. Su presin arterial diastlica es de 80 o ms. Use los medicamentos solamente como se lo haya indicado el mdico. Siga cuidadosamente las indicaciones. Los medicamentos para la presin arterial deben tomarse como se lo haya indicado el mdico. Los medicamentos pierden eficacia  al omitir las dosis. El hecho de omitir las dosis tambin aumenta el riesgo de otros problemas. Monitoreo Antes de controlarse la presin arterial: No fume, no consuma bebidas con cafena ni haga ejercicio dentro de los 30 minutos antes de tomar la medicin. Vaya al bao y vace la vejiga (orine). Permanezca sentado tranquilamente durante al menos 5 minutos antes de tomar las mediciones. Contrlese la presin arterial en su casa segn las indicaciones del mdico. Para hacer esto: Sintese con la espalda recta y con apoyo. Coloque los pies planos en el piso. No se cruce de piernas. Apoye el brazo sobre una superficie plana, como una mesa. Asegrese de que la parte superior del brazo est al nivel del corazn. Cada vez que tome una medicin, tome dos o tres lecturas con un minuto de separacin y anote los resultados. Posiblemente tambin sea necesario que el mdico le controle la presin arterial de manera regular. Informacin general Hable con su mdico acerca de la dieta, hbitos de ejercicio y otros factores del estilo de vida que pueden contribuir a la hipertensin. Revise con su mdico todos los medicamentos que toma ya que puede haber efectos secundarios o interacciones. Concurra a todas las visitas de seguimiento. El mdico puede ayudarle a crear y ajustar su plan para controlar la presin arterial alta. Dnde obtener ms informacin National Heart, Lung, and Blood Institute (Instituto Nacional del Corazn, los Pulmones y la Sangre): www.nhlbi.nih.gov American Heart Association (Asociacin Estadounidense del Corazn): www.heart.org Comunquese con un mdico si: Piensa que tiene una reaccin alrgica a los medicamentos   que ha tomado. Tiene dolores de cabeza frecuentes (recurrentes). Siente mareos. Tiene hinchazn en los tobillos. Tiene problemas de visin. Solicite ayuda de inmediato si: Siente un dolor de cabeza intenso o confusin. Siente debilidad inusual, adormecimiento o que se  desmayar. Siente un dolor intenso en el pecho o el abdomen. Vomita repetidas veces. Tiene dificultad para respirar. Estos sntomas pueden indicar una emergencia. Solicite ayuda de inmediato. Llame al 911. No espere a ver si los sntomas desaparecen. No conduzca por sus propios medios hasta el hospital. Resumen La hipertensin se produce cuando la sangre bombea en las arterias con mucha fuerza. Si esta afeccin no se controla, podra correr riesgo de tener complicaciones graves. La presin arterial deseada puede variar en funcin de las enfermedades, la edad y otros factores personales. Para la mayora de las personas, una presin arterial normal es menor que 120/80. La hipertensin se puede controlar mediante cambios en el estilo de vida, tomando medicamentos, o ambas cosas. Los cambios en el estilo de vida para ayudar a controlar la hipertensin incluyen prdida de peso, seguir una dieta saludable, con bajo contenido de sodio, hacer ms ejercicio, dejar de fumar y limitar el consumo de alcohol. Esta informacin no tiene como fin reemplazar el consejo del mdico. Asegrese de hacerle al mdico cualquier pregunta que tenga. Document Revised: 04/22/2021 Document Reviewed: 04/22/2021 Elsevier Patient Education  2024 [employer].  

## 2024-04-13 ENCOUNTER — Other Ambulatory Visit: Payer: Self-pay

## 2024-05-06 ENCOUNTER — Other Ambulatory Visit: Payer: Self-pay

## 2024-05-10 ENCOUNTER — Other Ambulatory Visit: Payer: Self-pay

## 2024-05-11 ENCOUNTER — Other Ambulatory Visit: Payer: Self-pay

## 2024-07-12 ENCOUNTER — Ambulatory Visit: Payer: Self-pay | Admitting: Family Medicine

## 2024-09-20 ENCOUNTER — Ambulatory Visit: Payer: Self-pay | Admitting: Family Medicine
